# Patient Record
Sex: Male | Born: 1995
Health system: Southern US, Community
[De-identification: ages and names within clinical notes are randomized; demographics above are authoritative.]

## PROBLEM LIST (undated history)

## (undated) DIAGNOSIS — F329 Major depressive disorder, single episode, unspecified: Secondary | ICD-10-CM

## (undated) DIAGNOSIS — F32A Depression, unspecified: Secondary | ICD-10-CM

## (undated) DIAGNOSIS — F419 Anxiety disorder, unspecified: Secondary | ICD-10-CM

## (undated) DIAGNOSIS — I10 Essential (primary) hypertension: Secondary | ICD-10-CM

## (undated) DIAGNOSIS — J45909 Unspecified asthma, uncomplicated: Secondary | ICD-10-CM

## (undated) HISTORY — DX: Unspecified asthma, uncomplicated: J45.909

## (undated) HISTORY — DX: Major depressive disorder, single episode, unspecified: F32.9

## (undated) HISTORY — PX: ADENOIDECTOMY: SUR15

## (undated) HISTORY — DX: Anxiety disorder, unspecified: F41.9

## (undated) HISTORY — PX: WISDOM TOOTH EXTRACTION: SHX21

## (undated) HISTORY — DX: Depression, unspecified: F32.A

---
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
## 2017-04-21 DIAGNOSIS — L700 Acne vulgaris: Secondary | ICD-10-CM | POA: Diagnosis not present

## 2017-06-13 DIAGNOSIS — L700 Acne vulgaris: Secondary | ICD-10-CM | POA: Diagnosis not present

## 2017-10-03 DIAGNOSIS — L700 Acne vulgaris: Secondary | ICD-10-CM | POA: Diagnosis not present

## 2017-12-06 ENCOUNTER — Ambulatory Visit (INDEPENDENT_AMBULATORY_CARE_PROVIDER_SITE_OTHER): Payer: 59 | Admitting: Psychology

## 2017-12-06 DIAGNOSIS — F321 Major depressive disorder, single episode, moderate: Secondary | ICD-10-CM | POA: Diagnosis not present

## 2017-12-06 DIAGNOSIS — F411 Generalized anxiety disorder: Secondary | ICD-10-CM | POA: Diagnosis not present

## 2017-12-14 ENCOUNTER — Ambulatory Visit (INDEPENDENT_AMBULATORY_CARE_PROVIDER_SITE_OTHER): Payer: 59 | Admitting: Psychology

## 2017-12-14 DIAGNOSIS — F411 Generalized anxiety disorder: Secondary | ICD-10-CM

## 2017-12-14 DIAGNOSIS — F321 Major depressive disorder, single episode, moderate: Secondary | ICD-10-CM

## 2017-12-21 ENCOUNTER — Ambulatory Visit (INDEPENDENT_AMBULATORY_CARE_PROVIDER_SITE_OTHER): Payer: 59 | Admitting: Psychology

## 2017-12-21 DIAGNOSIS — F321 Major depressive disorder, single episode, moderate: Secondary | ICD-10-CM | POA: Diagnosis not present

## 2017-12-21 DIAGNOSIS — F411 Generalized anxiety disorder: Secondary | ICD-10-CM

## 2017-12-27 ENCOUNTER — Ambulatory Visit (INDEPENDENT_AMBULATORY_CARE_PROVIDER_SITE_OTHER): Payer: 59 | Admitting: Internal Medicine

## 2017-12-27 VITALS — BP 118/70 | HR 68 | Temp 98.4°F | Ht 68.0 in | Wt 216.4 lb

## 2017-12-27 DIAGNOSIS — L700 Acne vulgaris: Secondary | ICD-10-CM

## 2017-12-27 DIAGNOSIS — F419 Anxiety disorder, unspecified: Secondary | ICD-10-CM | POA: Diagnosis not present

## 2017-12-27 DIAGNOSIS — D1722 Benign lipomatous neoplasm of skin and subcutaneous tissue of left arm: Secondary | ICD-10-CM | POA: Diagnosis not present

## 2017-12-27 DIAGNOSIS — F32A Depression, unspecified: Secondary | ICD-10-CM

## 2017-12-27 DIAGNOSIS — F329 Major depressive disorder, single episode, unspecified: Secondary | ICD-10-CM | POA: Diagnosis not present

## 2017-12-27 MED ORDER — BENZOYL PEROXIDE 10 % EX LIQD: 1.0000 | Freq: Every day | CUTANEOUS | 2 refills | Status: DC

## 2017-12-27 NOTE — Patient Instructions (Addendum)
Try Cetaphil antibacterial wash to body and face  Try Benzoyl peroxide wash to your face and body at night  I will call you later  Think about labwork to check liver kidney, blood counts, urine and thyroid function  We are getting a copy of vaccination record from [place]   Acne Acne is a skin problem that causes pimples. Acne occurs when the pores in the skin get blocked. The pores may become infected with bacteria, or they may become red, sore, and swollen. Acne is a common skin problem, especially for teenagers. Acne usually goes away over time. What are the causes? Each pore contains an oil gland. Oil glands make an oily substance that is called sebum. Acne happens when these glands get plugged with sebum, dead skin cells, and dirt. Then, the bacteria that are normally found in the oil glands multiply and cause inflammation. Acne is commonly triggered by changes in your hormones. These hormonal changes can cause the oil glands to get bigger and to make more sebum. Factors that can make acne worse include:  Hormone changes during: ? Adolescence. ? Women's menstrual cycles. ? Pregnancy.  Oil-based cosmetics and hair products.  Harshly scrubbing the skin.  Strong soaps.  Stress.  Hormone problems that are due to certain diseases.  Long or oily hair rubbing against the skin.  Certain medicines.  Pressure from headbands, backpacks, or shoulder pads.  Exposure to certain oils and chemicals.  What increases the risk? This condition is more likely to develop in:  Teenagers.  People who have a family history of acne.  What are the signs or symptoms? Acne often occurs on the face, neck, chest, and upper back. Symptoms include:  Small, red bumps (pimples or papules).  Whiteheads.  Blackheads.  Small, pus-filled pimples (pustules).  Big, red pimples or pustules that feel tender.  More severe acne can cause:  An infected area that contains a collection of pus  (abscess).  Hard, painful, fluid-filled sacs (cysts).  Scars.  How is this diagnosed? This condition is diagnosed with a medical history and physical exam. Blood tests may also be done. How is this treated? Treatment for this condition can vary depending on the severity of your acne. Treatment may include:  Creams and lotions that prevent oil glands from clogging.  Creams and lotions that treat or prevent infections and inflammation.  Antibiotic medicines that are applied to the skin or taken as a pill.  Pills that decrease sebum production.  Birth control pills.  Light or laser treatments.  Surgery.  Injections of medicine into the affected areas.  Chemicals that cause peeling of the skin.  Your health care provider will also recommend the best way to take care of your skin. Good skin care is the most important part of treatment. Follow these instructions at home: Skin care Take care of your skin as told by your health care provider. You may be told to do these things:  Wash your skin gently at least two times each day, as well as: ? After you exercise. ? Before you go to bed.  Use mild soap.  Apply a water-based skin moisturizer after you wash your skin.  Use a sunscreen or sunblock with SPF 30 or greater. This is especially important if you are using acne medicines.  Choose cosmetics that will not plug your oil glands (are noncomedogenic).  Medicines  Take over-the-counter and prescription medicines only as told by your health care provider.  If you were prescribed  an antibiotic medicine, apply or take it as told by your health care provider. Do not stop taking the antibiotic even if your condition improves. General instructions  Keep your hair clean and off of your face. If you have oily hair, shampoo your hair regularly or daily.  Avoid leaning your chin or forehead against your hands.  Avoid wearing tight headbands or hats.  Avoid picking or squeezing  your pimples. That can make your acne worse and cause scarring.  Keep all follow-up visits as told by your health care provider. This is important.  Shave gently and only when necessary.  Keep a food journal to figure out if any foods are linked with your acne. Contact a health care provider if:  Your acne is not better after eight weeks.  Your acne gets worse.  You have a large area of skin that is red or tender.  You think that you are having side effects from any acne medicine. This information is not intended to replace advice given to you by your health care provider. Make sure you discuss any questions you have with your health care provider. Document Released: 09/17/2000 Document Revised: 05/21/2016 Document Reviewed: 11/27/2014 Elsevier Interactive Patient Education  2018 [employer].  Lipoma A lipoma is a noncancerous (benign) tumor that is made up of fat cells. This is a very common type of soft-tissue growth. Lipomas are usually found under the skin (subcutaneous). They may occur in any tissue of the body that contains fat. Common areas for lipomas to appear include the back, shoulders, buttocks, and thighs. Lipomas grow slowly, and they are usually painless. Most lipomas do not cause problems and do not require treatment. What are the causes? The cause of this condition is not known. What increases the risk? This condition is more likely to develop in:  People who are 34-38 years old.  People who have a family history of lipomas.  What are the signs or symptoms? A lipoma usually appears as a small, round bump under the skin. It may feel soft or rubbery, but the firmness can vary. Most lipomas are not painful. However, a lipoma may become painful if it is located in an area where it pushes on nerves. How is this diagnosed? A lipoma can usually be diagnosed with a physical exam. You may also have tests to confirm the diagnosis and to rule out other conditions. Tests may  include:  Imaging tests, such as a CT scan or MRI.  Removal of a tissue sample to be looked at under a microscope (biopsy).  How is this treated? Treatment is not needed for small lipomas that are not causing problems. If a lipoma continues to get bigger or it causes problems, removal is often the best option. Lipomas can also be removed to improve appearance. Removal of a lipoma is usually done with a surgery in which the fatty cells and the surrounding capsule are removed. Most often, a medicine that numbs the area (local anesthetic) is used for this procedure. Follow these instructions at home:  Keep all follow-up visits as directed by your health care provider. This is important. Contact a health care provider if:  Your lipoma becomes larger or hard.  Your lipoma becomes painful, red, or increasingly swollen. These could be signs of infection or a more serious condition. This information is not intended to replace advice given to you by your health care provider. Make sure you discuss any questions you have with your health care provider. Document  Released: 09/10/2002 Document Revised: 02/26/2016 Document Reviewed: 09/16/2014 Elsevier Interactive Patient Education  [employer].

## 2017-12-27 NOTE — Progress Notes (Signed)
Pre visit review using our clinic review tool, if applicable. No additional management support is needed unless otherwise documented below in the visit note. 

## 2017-12-28 ENCOUNTER — Ambulatory Visit (INDEPENDENT_AMBULATORY_CARE_PROVIDER_SITE_OTHER): Payer: 59 | Admitting: Psychology

## 2017-12-28 DIAGNOSIS — F321 Major depressive disorder, single episode, moderate: Secondary | ICD-10-CM

## 2017-12-28 DIAGNOSIS — F411 Generalized anxiety disorder: Secondary | ICD-10-CM

## 2017-12-29 ENCOUNTER — Encounter: Payer: Self-pay | Admitting: Internal Medicine

## 2017-12-29 DIAGNOSIS — D1722 Benign lipomatous neoplasm of skin and subcutaneous tissue of left arm: Secondary | ICD-10-CM | POA: Insufficient documentation

## 2017-12-29 DIAGNOSIS — F419 Anxiety disorder, unspecified: Principal | ICD-10-CM

## 2017-12-29 DIAGNOSIS — F329 Major depressive disorder, single episode, unspecified: Secondary | ICD-10-CM | POA: Insufficient documentation

## 2017-12-29 DIAGNOSIS — L700 Acne vulgaris: Secondary | ICD-10-CM | POA: Insufficient documentation

## 2017-12-29 NOTE — Progress Notes (Signed)
Chief Complaint  Patient presents with  . Establish Care   New patient to establish care prior pt at [facility] Dr. Wynetta Emery  Of note called mother after visit per pt request ([phone]) [phone] and disc details of the visit and obtained history  1. Anxiety and depression following with Alene Mires for counseling has appt with psychiatry 01/05/18. He was on Lexapro 20 mg in the past pt does not think helped but mother think she saw a difference. She reports he at one time was dx'ed with ADHD which he does not have and was on Stratterra which did not work for him. He has seen psychiatry in the past as a child 2. Acne to face, chest and back not currently using anything but has seen dermatology in the past for acne  3. C/o left forearm growth/lesion been there for a while and not changing or painful   Review of Systems  Constitutional: Negative for weight loss.  HENT: Negative for hearing loss.   Eyes: Negative for blurred vision.  Respiratory: Negative for shortness of breath.   Cardiovascular: Negative for chest pain.  Gastrointestinal: Negative for abdominal pain.  Musculoskeletal: Negative for joint pain.  Skin:       +acne   Neurological: Negative for headaches.  Psychiatric/Behavioral: Positive for depression. Negative for suicidal ideas. The patient is nervous/anxious.    Past Medical History:  Diagnosis Date  . Anxiety   . Asthma   . Depression    Past Surgical History:  Procedure Laterality Date  . ADENOIDECTOMY     2004   Family History  Problem Relation Age of Onset  . Depression Mother   . Hyperlipidemia Mother   . Hypertension Mother   . Asthma Sister   . Cancer Paternal Grandmother   . Early death Paternal Grandmother   . Cancer Paternal Grandfather    Social History   Socioeconomic History  . Marital status: Single    Spouse name: Not on file  . Number of children: Not on file  . Years of education: Not on file  . Highest education level: Not on file    Occupational History  . Not on file  Social Needs  . Financial resource strain: Not on file  . Food insecurity:    Worry: Not on file    Inability: Not on file  . Transportation needs:    Medical: Not on file    Non-medical: Not on file  Tobacco Use  . Smoking status: Never Smoker  . Smokeless tobacco: Never Used  Substance and Sexual Activity  . Alcohol use: Not Currently  . Drug use: Not Currently  . Sexual activity: Not Currently  Lifestyle  . Physical activity:    Days per week: Not on file    Minutes per session: Not on file  . Stress: Not on file  Relationships  . Social connections:    Talks on phone: Not on file    Gets together: Not on file    Attends religious service: Not on file    Active member of club or organization: Not on file    Attends meetings of clubs or organizations: Not on file    Relationship status: Not on file  . Intimate partner violence:    Fear of current or ex partner: Not on file    Emotionally abused: Not on file    Physically abused: Not on file    Forced sexual activity: Not on file  Other Topics Concern  . Not  on file  Social History Narrative   College at [employer]   Wears seat belt    Safe in relationship    Current Meds  Medication Sig  . escitalopram (LEXAPRO) 20 MG tablet Take 20 mg by mouth daily.   No Known Allergies No results found for this or any previous visit (from the past 2160 hour(s)). Objective  Body mass index is 32.9 kg/m. Wt Readings from Last 3 Encounters:  12/27/17 216 lb 6.4 oz (98.2 kg)   Temp Readings from Last 3 Encounters:  12/27/17 98.4 F (36.9 C) (Oral)   BP Readings from Last 3 Encounters:  12/27/17 118/70   Pulse Readings from Last 3 Encounters:  12/27/17 68    Physical Exam  Constitutional: He is oriented to person, place, and time and well-developed, well-nourished, and in no distress. Vital signs are normal.  HENT:  Head: Normocephalic and atraumatic.  Mouth/Throat: Oropharynx is  clear and moist and mucous membranes are normal.  Eyes: Pupils are equal, round, and reactive to light. Conjunctivae are normal.  Cardiovascular: Normal rate, regular rhythm and normal heart sounds.  Pulmonary/Chest: Effort normal and breath sounds normal.  Abdominal: Soft. Bowel sounds are normal.  Neurological: He is alert and oriented to person, place, and time. Gait normal. Gait normal.  Skin: Skin is warm and dry.     Acne to face chest and back   Psychiatric: Mood, memory and judgment normal. He has a flat affect.  Nursing note and vitals reviewed.   Assessment   1. Anxiety/depression  2. Ance vulgaris  3. Likely lipoma to left forearm  4. HM Plan  1. Collateral info obtained from mother as far as previous meds see HPI appt with psych 01/05/18 further tx  F/u with Alene Mires for counseling as well  Requested records from upcoming psych appt  2. rec use OTC cetaphil antibaterial wash with benzoyl peroxide wash to face, chest and body  Assess at f/u if not better consider oral tetracyclines 3. Monitor given info  4. Declined flu shot  Get vx record from [place] today disc with mother will reassess at f/u  Provider: Dr. Olivia Mackie McLean-Scocuzza-[facility]

## 2018-01-03 ENCOUNTER — Ambulatory Visit: Payer: Self-pay | Admitting: Psychiatry

## 2018-01-04 ENCOUNTER — Ambulatory Visit (INDEPENDENT_AMBULATORY_CARE_PROVIDER_SITE_OTHER): Payer: 59 | Admitting: Psychology

## 2018-01-04 DIAGNOSIS — F321 Major depressive disorder, single episode, moderate: Secondary | ICD-10-CM

## 2018-01-04 DIAGNOSIS — F411 Generalized anxiety disorder: Secondary | ICD-10-CM

## 2018-01-05 ENCOUNTER — Ambulatory Visit (INDEPENDENT_AMBULATORY_CARE_PROVIDER_SITE_OTHER): Payer: 59 | Admitting: Psychiatry

## 2018-01-05 ENCOUNTER — Other Ambulatory Visit: Payer: Self-pay

## 2018-01-05 ENCOUNTER — Encounter: Payer: Self-pay | Admitting: Psychiatry

## 2018-01-05 VITALS — BP 142/80 | HR 97 | Temp 97.4°F | Wt 216.4 lb

## 2018-01-05 DIAGNOSIS — F330 Major depressive disorder, recurrent, mild: Secondary | ICD-10-CM

## 2018-01-05 DIAGNOSIS — F4010 Social phobia, unspecified: Secondary | ICD-10-CM

## 2018-01-05 DIAGNOSIS — F411 Generalized anxiety disorder: Secondary | ICD-10-CM

## 2018-01-05 MED ORDER — FLUOXETINE HCL 10 MG PO CAPS: 10.0000 mg | ORAL_CAPSULE | Freq: Every day | ORAL | 1 refills | Status: DC

## 2018-01-05 NOTE — Progress Notes (Signed)
Psychiatric Initial Adult Assessment   Patient Identification: Cory Cory [name] MRN:  [id number] Date of Evaluation:  01/05/2018 Referral Source: Therapist Cory Cory [name] Chief Complaint:  ' I am anxious." Chief Complaint    Establish Care; Anxiety; ADHD; Panic Attack; Insomnia     Visit Diagnosis:    ICD-10-CM   1. GAD (generalized anxiety disorder) F41.1 FLUoxetine (PROZAC) 10 MG capsule  2. Social anxiety disorder F40.10   3. MDD (major depressive disorder), recurrent episode, mild (HCC) F33.0     History of Present Illness:  Cory Cory [name] Cory [name] a 22 year old Caucasian male, single, lives in [place], student at [employer] Cory [name], has a history of depression and anxiety, presented to the clinic Cory [name] to establish care.  Crit Cory [name] presented along with his mother.  Patient as well as mother participated in the evaluation together.  Mother provided collateral information.  Cory Cory [name] reports he has been struggling with depression since the age of 22 years old.  Patient reports he has tried medications like Lexapro, Zoloft, Strattera in the past.  Most recently he was on Lexapro but quit taking it 4 years ago.  Patient was on Lexapro for at least 2 years.  While he was on Lexapro he did well for a while but then he quit taking it because he felt like it was not helping anymore.  Patient currently Cory [name] in psychotherapy with therapists Cory Cory [name].  Patient reports most recently he has been struggling with some depressive symptoms like sadness on a daily basis, low energy, lack of motivation, sleep problems, lack of appetite and so on.  Patient did have some suicidal thoughts in the past but currently denies it.  Patient also denies any suicide attempts.  Patient also reports anxiety symptoms.  Patient reports racing heart rate, shortness of breath, nervousness, trouble relaxing, being easily annoyed or irritable and so on.  Patient reports he Cory [name] also socially anxious.  He also reports panic symptoms when he  cannot breathe and has pressure on his chest.  Patient reports this happens for 5 minutes or so.  He copes by distracting himself and moving away from the situation.  Pt does report a history of bullying while at school.  Patient reports it did affect him at that time but he does not think about it a lot this time.   Patient denies any history of mania or hypomania.  Patient denies any perceptual disturbances.  Patient currently sees Cory Cory [name] for therapy.  Per mother patient even though was depressed did very well in school.  Patient also did not have any developmental problems as a child.  Associated Signs/Symptoms: Depression Symptoms:  depressed mood, fatigue, anxiety, disturbed sleep, decreased appetite,   (Hypo) Manic Symptoms:  denies   Anxiety Symptoms:  Excessive Worry, Panic Symptoms, Social Anxiety, Psychotic Symptoms:  denies PTSD Symptoms: Negative  Past Psychiatric History: Patient reports a history of depression since the age of 22 years old.  Patient has had psychotherapy in the past.  Patient currently sees therapists Cory Cory [name].  Patient has tried medications, antidepressants and antianxiety agents in the past.  Patient denies any suicide attempts.  Previous Psychotropic Medications: Yes , Lexapro, Zoloft, Strattera.  Substance Abuse History in the last 12 months:  No.  Consequences of Substance Abuse: Negative  Past Medical History:  Past Medical History:  Diagnosis Date  . Anxiety   . Asthma   . Depression     Past Surgical History:  Procedure Laterality Date  . ADENOIDECTOMY     2004  Family Psychiatric History: Mother has  depression.  Family History:  Family History  Problem Relation Age of Onset  . Depression Mother   . Hyperlipidemia Mother   . Hypertension Mother   . Anxiety disorder Mother   . Asthma Sister   . Cancer Paternal Grandmother   . Early death Paternal Grandmother   . Cancer Paternal Grandfather      Social History:   Social History   Socioeconomic History  . Marital status: Single    Spouse name: Not on file  . Number of children: 0  . Years of education: Not on file  . Highest education level: Some college, no degree  Occupational History    Comment: full time student  Social Needs  . Financial resource strain: Not on file  . Food insecurity:    Worry: Not on file    Inability: Not on file  . Transportation needs:    Medical: No    Non-medical: No  Tobacco Use  . Smoking status: Former Smoker    Types: E-cigarettes, Cigarettes  . Smokeless tobacco: Never Used  . Tobacco comment: tired a couple  times  Substance and Sexual Activity  . Alcohol use: Not Currently  . Drug use: Not Currently  . Sexual activity: Not Currently  Lifestyle  . Physical activity:    Days per week: 4 days    Minutes per session: 30 min  . Stress: Very much  Relationships  . Social connections:    Talks on phone: More than three times a week    Gets together: More than three times a week    Attends religious service: Never    Active member of club or organization: No    Attends meetings of clubs or organizations: Never    Relationship status: Never married  Other Topics Concern  . Not on file  Social History Narrative   College at [employer] Cory [name]   Wears seat belt    Safe in relationship     Additional Social History: Patient Cory [name] single.  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  He lives in [place] with his mother.  He has 2 siblings a brother and a sister.  Patient's mother Cory [name] a Pharmacist, Cory [name].  His parents are divorced.  He however reports his father Cory [name] very involved in his life.  He loves to play sports, baseball, basketball and soccer.  Allergies:  No Known Allergies  Metabolic Disorder Labs: No results found for: HGBA1C, MPG No results found for: PROLACTIN No results found for: CHOL, TRIG, HDL, CHOLHDL, VLDL, LDLCALC   Current Medications: Current Outpatient Medications  Medication  Sig Dispense Refill  . Benzoyl Peroxide (BENZOYL PEROXIDE) 10 % LIQD Apply 1 Bottle topically at bedtime. Apply in the shower to face, chest and back lather and let sit then rinse. 140 g 2  . FLUoxetine (PROZAC) 10 MG capsule Take 1 capsule (10 mg total) by mouth daily with breakfast. 30 capsule 1   No current facility-administered medications for this visit.     Neurologic: Headache: No Seizure: No Paresthesias:No  Musculoskeletal: Strength & Muscle Tone: within normal limits Gait & Station: normal Patient leans: N/A  Psychiatric Specialty Exam: Review of Systems  Psychiatric/Behavioral: Positive for depression. The patient Cory [name] nervous/anxious.   All other systems reviewed and are negative.   Blood pressure (!) 142/80, pulse 97, temperature (!) 97.4 F (36.3 C), temperature source Oral, weight 216 lb 6.4 oz (98.2 kg).Body mass index Cory [name] 32.9 kg/m.  General Appearance:  Casual  Eye Contact:  Fair  Speech:  Normal Rate  Volume:  Normal  Mood:  Anxious and Dysphoric  Affect:  Congruent  Thought Process:  Goal Directed and Descriptions of Associations: Intact  Orientation:  Full (Time, Place, and Person)  Thought Content:  Logical  Suicidal Thoughts:  No  Homicidal Thoughts:  No  Memory:  Immediate;   Fair Recent;   Fair Remote;   Fair  Judgement:  Fair  Insight:  Fair  Psychomotor Activity:  Normal  Concentration:  Concentration: Fair and Attention Span: Fair  Recall:  Cory Cory [name]: Fair  Akathisia:  No  Handed:  Right  AIMS (if indicated):  na  Assets:  Communication Skills Desire for Cory [place] [name] [place] Talents/Skills Transportation Vocational/Educational  ADL's:  Intact  Cognition: WNL  Sleep:  Fair on medications    Treatment Plan Summary: Cory Cory [name] a 22 year old Caucasian male, single, lives in [place], has a history of depression, anxiety, presented to the clinic Cory [name] to establish care.   Cory Cory [name] has a long-term history of depression since the age of 11.  He Cory [name] currently in psychotherapy with therapists Cory Cory [name].  Patient has good social support system.  Denies any suicidality or substance abuse problems. Discussed medication with patient.  Plan as noted below. Medication management and Plan as noted below   Plan  MDD Start Prozac 10 mg daily with breakfast. Provided medication education, provided handouts. PHQ 9 equals 14  For GAD Prozac 10 mg daily with breakfast GAD 7 equals 13 Continue therapy with therapists [facility] Cory [name].  Social anxiety disorder Continue CBT.  We will order TSH if not already done.  Provided medication education, provided handouts.  Follow-up in clinic in 3 weeks or sooner if needed.  More than 50 % of the time was spent for psychoeducation and supportive psychotherapy and care coordination.  This note was generated in part or whole with voice recognition software. Voice recognition Cory [name] usually quite accurate but there are transcription errors that can and very often do occur. I apologize for any typographical errors that were not detected and corrected.         Ursula Alert, MD 4/5/20198:46 AM

## 2018-01-05 NOTE — Patient Instructions (Signed)
Fluoxetine capsules or tablets (Depression/Mood Disorders) What is this medicine? FLUOXETINE (floo OX e teen) belongs to a class of drugs known as selective serotonin reuptake inhibitors (SSRIs). It helps to treat mood problems such as depression, obsessive compulsive disorder, and panic attacks. It can also treat certain eating disorders. This medicine may be used for other purposes; ask your health care provider or pharmacist if you have questions. COMMON BRAND NAME(S): Prozac What should I tell my health care provider before I take this medicine? They need to know if you have any of these conditions: -bipolar disorder or a family history of bipolar disorder -bleeding disorders -glaucoma -heart disease -liver disease -low levels of sodium in the blood -seizures -suicidal thoughts, plans, or attempt; a previous suicide attempt by you or a family member -take MAOIs like Carbex, Eldepryl, Marplan, Nardil, and Parnate -take medicines that treat or prevent blood clots -thyroid disease -an unusual or allergic reaction to fluoxetine, other medicines, foods, dyes, or preservatives -pregnant or trying to get pregnant -breast-feeding How should I use this medicine? Take this medicine by mouth with a glass of water. Follow the directions on the prescription label. You can take this medicine with or without food. Take your medicine at regular intervals. Do not take it more often than directed. Do not stop taking this medicine suddenly except upon the advice of your doctor. Stopping this medicine too quickly may cause serious side effects or your condition may worsen. A special MedGuide will be given to you by the pharmacist with each prescription and refill. Be sure to read this information carefully each time. Talk to your pediatrician regarding the use of this medicine in children. While this drug may be prescribed for children as young as 7 years for selected conditions, precautions do  apply. Overdosage: If you think you have taken too much of this medicine contact a poison control center or emergency room at once. NOTE: This medicine is only for you. Do not share this medicine with others. What if I miss a dose? If you miss a dose, skip the missed dose and go back to your regular dosing schedule. Do not take double or extra doses. What may interact with this medicine? Do not take this medicine with any of the following medications: -other medicines containing fluoxetine, like Sarafem or Symbyax -cisapride -linezolid -MAOIs like Carbex, Eldepryl, Marplan, Nardil, and Parnate -methylene blue (injected into a vein) -pimozide -thioridazine This medicine may also interact with the following medications: -alcohol -amphetamines -aspirin and aspirin-like medicines -carbamazepine -certain medicines for depression, anxiety, or psychotic disturbances -certain medicines for migraine headaches like almotriptan, eletriptan, frovatriptan, naratriptan, rizatriptan, sumatriptan, zolmitriptan -digoxin -diuretics -fentanyl -flecainide -furazolidone -isoniazid -lithium -medicines for sleep -medicines that treat or prevent blood clots like warfarin, enoxaparin, and dalteparin -NSAIDs, medicines for pain and inflammation, like ibuprofen or naproxen -phenytoin -procarbazine -propafenone -rasagiline -ritonavir -supplements like St. John's wort, kava kava, valerian -tramadol -tryptophan -vinblastine This list may not describe all possible interactions. Give your health care provider a list of all the medicines, herbs, non-prescription drugs, or dietary supplements you use. Also tell them if you smoke, drink alcohol, or use illegal drugs. Some items may interact with your medicine. What should I watch for while using this medicine? Tell your doctor if your symptoms do not get better or if they get worse. Visit your doctor or health care professional for regular checks on your  progress. Because it may take several weeks to see the full effects of this medicine, it   is important to continue your treatment as prescribed by your doctor. Patients and their families should watch out for new or worsening thoughts of suicide or depression. Also watch out for sudden changes in feelings such as feeling anxious, agitated, panicky, irritable, hostile, aggressive, impulsive, severely restless, overly excited and hyperactive, or not being able to sleep. If this happens, especially at the beginning of treatment or after a change in dose, call your health care professional. You may get drowsy or dizzy. Do not drive, use machinery, or do anything that needs mental alertness until you know how this medicine affects you. Do not stand or sit up quickly, especially if you are an older patient. This reduces the risk of dizzy or fainting spells. Alcohol may interfere with the effect of this medicine. Avoid alcoholic drinks. Your mouth may get dry. Chewing sugarless gum or sucking hard candy, and drinking plenty of water may help. Contact your doctor if the problem does not go away or is severe. This medicine may affect blood sugar levels. If you have diabetes, check with your doctor or health care professional before you change your diet or the dose of your diabetic medicine. What side effects may I notice from receiving this medicine? Side effects that you should report to your doctor or health care professional as soon as possible: -allergic reactions like skin rash, itching or hives, swelling of the face, lips, or tongue -anxious -black, tarry stools -breathing problems -changes in vision -confusion -elevated mood, decreased need for sleep, racing thoughts, impulsive behavior -eye pain -fast, irregular heartbeat -feeling faint or lightheaded, falls -feeling agitated, angry, or irritable -hallucination, loss of contact with reality -loss of balance or coordination -loss of memory -painful  or prolonged erections -restlessness, pacing, inability to keep still -seizures -stiff muscles -suicidal thoughts or other mood changes -trouble sleeping -unusual bleeding or bruising -unusually weak or tired -vomiting Side effects that usually do not require medical attention (report to your doctor or health care professional if they continue or are bothersome): -change in appetite or weight -change in sex drive or performance -diarrhea -dry mouth -headache -increased sweating -nausea -tremors This list may not describe all possible side effects. Call your doctor for medical advice about side effects. You may report side effects to FDA at 1-800-FDA-1088. Where should I keep my medicine? Keep out of the reach of children. Store at room temperature between 15 and 30 degrees C (59 and 86 degrees F). Throw away any unused medicine after the expiration date. NOTE: This sheet is a summary. It may not cover all possible information. If you have questions about this medicine, talk to your doctor, pharmacist, or health care provider.  2018 Elsevier/Gold Standard (2016-02-21 15:55:27)  

## 2018-01-06 ENCOUNTER — Encounter: Payer: Self-pay | Admitting: Internal Medicine

## 2018-01-06 ENCOUNTER — Ambulatory Visit (INDEPENDENT_AMBULATORY_CARE_PROVIDER_SITE_OTHER): Payer: 59 | Admitting: Internal Medicine

## 2018-01-06 ENCOUNTER — Encounter: Payer: Self-pay | Admitting: Psychiatry

## 2018-01-06 VITALS — BP 126/60 | HR 100 | Temp 98.9°F | Ht 68.0 in | Wt 219.6 lb

## 2018-01-06 DIAGNOSIS — R519 Headache, unspecified: Secondary | ICD-10-CM | POA: Insufficient documentation

## 2018-01-06 DIAGNOSIS — R51 Headache: Secondary | ICD-10-CM

## 2018-01-06 MED ORDER — BUTALBITAL-APAP-CAFFEINE 50-325-40 MG PO TABS: 1.0000 | ORAL_TABLET | Freq: Three times a day (TID) | ORAL | 0 refills | Status: DC | PRN

## 2018-01-06 NOTE — Progress Notes (Signed)
Pre visit review using our clinic review tool, if applicable. No additional management support is needed unless otherwise documented below in the visit note. 

## 2018-01-06 NOTE — Progress Notes (Addendum)
Chief Complaint  Patient presents with  . Headache    for two week    F/u  1. C/o h/a temporal and crown x 2 weeks ? Stress related also had been drinking 7-8 sodas daily and stopped this amt abruptly over the last week. Pain was 2/10 constant x 2 weeks no n/v. No h/o migraines. He feels like his head is heavy. He usu. Gets 8 hrs of sleep but last night 5-6/10. He has tried Ibuprofen, Excedrin migraine 2 pills which has not helped. H/a is annoying FH migraines in ? Mom per pt.  He  Has not started Prozac yet 10 mg and will start tonight .   Review of Systems  Constitutional: Negative for fever.  Eyes: Negative for blurred vision.  Respiratory: Negative for shortness of breath.   Cardiovascular: Negative for chest pain.  Gastrointestinal: Negative for nausea and vomiting.  Neurological: Positive for headaches.   Past Medical History:  Diagnosis Date  . Anxiety   . Asthma   . Depression    Past Surgical History:  Procedure Laterality Date  . ADENOIDECTOMY     2004   Family History  Problem Relation Age of Onset  . Depression Mother   . Hyperlipidemia Mother   . Hypertension Mother   . Anxiety disorder Mother   . Asthma Sister   . Cancer Paternal Grandmother   . Early death Paternal Grandmother   . Cancer Paternal Grandfather    Social History   Socioeconomic History  . Marital status: Single    Spouse name: Not on file  . Number of children: 0  . Years of education: Not on file  . Highest education level: Some college, no degree  Occupational History    Comment: full time student  Social Needs  . Financial resource strain: Not on file  . Food insecurity:    Worry: Not on file    Inability: Not on file  . Transportation needs:    Medical: No    Non-medical: No  Tobacco Use  . Smoking status: Former Smoker    Types: E-cigarettes, Cigarettes  . Smokeless tobacco: Never Used  . Tobacco comment: tired a couple  times  Substance and Sexual Activity  . Alcohol  use: Not Currently  . Drug use: Not Currently  . Sexual activity: Not Currently  Lifestyle  . Physical activity:    Days per week: 4 days    Minutes per session: 30 min  . Stress: Very much  Relationships  . Social connections:    Talks on phone: More than three times a week    Gets together: More than three times a week    Attends religious service: Never    Active member of club or organization: No    Attends meetings of clubs or organizations: Never    Relationship status: Never married  . Intimate partner violence:    Fear of current or ex partner: No    Emotionally abused: No    Physically abused: No    Forced sexual activity: No  Other Topics Concern  . Not on file  Social History Narrative   College at [employer]   Wears seat belt    Safe in relationship    Current Meds  Medication Sig  . Benzoyl Peroxide (BENZOYL PEROXIDE) 10 % LIQD Apply topically at bedtime.  Marland Kitchen FLUoxetine (PROZAC) 10 MG capsule Take 1 capsule (10 mg total) by mouth daily with breakfast.  . [DISCONTINUED] Benzoyl Peroxide (BENZOYL PEROXIDE) 10 %  LIQD Apply 1 Bottle topically at bedtime. Apply in the shower to face, chest and back lather and let sit then rinse.   No Known Allergies No results found for this or any previous visit (from the past 2160 hour(s)). Objective  Body mass index is 33.39 kg/m. Wt Readings from Last 3 Encounters:  01/06/18 219 lb 9.6 oz (99.6 kg)  01/05/18 216 lb 6.4 oz (98.2 kg)  12/27/17 216 lb 6.4 oz (98.2 kg)   Temp Readings from Last 3 Encounters:  01/06/18 98.9 F (37.2 C) (Oral)  01/05/18 (!) 97.4 F (36.3 C) (Oral)  12/27/17 98.4 F (36.9 C) (Oral)   BP Readings from Last 3 Encounters:  01/06/18 126/60  01/05/18 (!) 142/80  12/27/17 118/70   Pulse Readings from Last 3 Encounters:  01/06/18 100  01/05/18 97  12/27/17 68    Physical Exam  Constitutional: He is oriented to person, place, and time and well-developed, well-nourished, and in no distress. Vital  signs are normal.  HENT:  Head: Normocephalic and atraumatic.  Nose: Right sinus exhibits frontal sinus tenderness. Left sinus exhibits frontal sinus tenderness.  Eyes: Pupils are equal, round, and reactive to light. Conjunctivae are normal.  Cardiovascular: Normal rate, regular rhythm and normal heart sounds.  Pulmonary/Chest: Effort normal.  Neurological: He is alert and oriented to person, place, and time. Gait normal. Gait normal.  CN 2-12 grossly intact  Normal motor upper and lower ext b/l 5/5  Normal sensation   Skin: Skin is warm, dry and intact.  Psychiatric: Mood, memory, affect and judgment normal.  Nursing note and vitals reviewed.   Assessment   1. H/a ddx/etiology tension, increased caffeine intake, reduced sleep/hydration, sinus pain though pt has not other sxs of sinusitis. Not c/w meningeal sx's  Plan  1. Trial of fiorocet tid prn 1-2 pills not to take more than 2x per week Stop excedrine migraine for now Increase sleep and water  Taper caffeine but done not abruptly stop  F/u in 1 week prn otherwise as sch  CT head ordered if h/a improved will cancel   Vaccines-see chart scanned in  Tdap 03/25/09  Had menveo 07/19/11 and 03/19/15 Had hep B vaccine  Had varicella x 2 doses  Had polio  Had MMR Had Hib Had hep A vaccine    Provider: Dr. Olivia Mackie Mc[facility]oc[facility]-Internal Medicine

## 2018-01-06 NOTE — Patient Instructions (Addendum)
Please follow up as scheduled sooner if needed We have ordered CT head   General Headache Without Cause A headache is pain or discomfort felt around the head or neck area. There are many causes and types of headaches. In some cases, the cause may not be found. Follow these instructions at home: Managing pain  Take over-the-counter and prescription medicines only as told by your doctor.  Lie down in a dark, quiet room when you have a headache.  If directed, apply ice to the head and neck area: ? Put ice in a plastic bag. ? Place a towel between your skin and the bag. ? Leave the ice on for 20 minutes, 2-3 times per day.  Use a heating pad or hot shower to apply heat to the head and neck area as told by your doctor.  Keep lights dim if bright lights bother you or make your headaches worse. Eating and drinking  Eat meals on a regular schedule.  Lessen how much alcohol you drink.  Lessen how much caffeine you drink, or stop drinking caffeine. General instructions  Keep all follow-up visits as told by your doctor. This is important.  Keep a journal to find out if certain things bring on headaches. For example, write down: ? What you eat and drink. ? How much sleep you get. ? Any change to your diet or medicines.  Relax by getting a massage or doing other relaxing activities.  Lessen stress.  Sit up straight. Do not tighten (tense) your muscles.  Do not use tobacco products. This includes cigarettes, chewing tobacco, or e-cigarettes. If you need help quitting, ask your doctor.  Exercise regularly as told by your doctor.  Get enough sleep. This often means 7-9 hours of sleep. Contact a doctor if:  Your symptoms are not helped by medicine.  You have a headache that feels different than the other headaches.  You feel sick to your stomach (nauseous) or you throw up (vomit).  You have a fever. Get help right away if:  Your headache becomes really bad.  You keep  throwing up.  You have a stiff neck.  You have trouble seeing.  You have trouble speaking.  You have pain in the eye or ear.  Your muscles are weak or you lose muscle control.  You lose your balance or have trouble walking.  You feel like you will pass out (faint) or you pass out.  You have confusion. This information is not intended to replace advice given to you by your health care provider. Make sure you discuss any questions you have with your health care provider. Document Released: 06/29/2008 Document Revised: 02/26/2016 Document Reviewed: 01/13/2015 Elsevier Interactive Patient Education  [employer].

## 2018-01-11 ENCOUNTER — Ambulatory Visit (INDEPENDENT_AMBULATORY_CARE_PROVIDER_SITE_OTHER): Payer: 59 | Admitting: Psychology

## 2018-01-11 DIAGNOSIS — F411 Generalized anxiety disorder: Secondary | ICD-10-CM

## 2018-01-11 DIAGNOSIS — F321 Major depressive disorder, single episode, moderate: Secondary | ICD-10-CM

## 2018-01-12 ENCOUNTER — Ambulatory Visit
Admission: RE | Admit: 2018-01-12 | Discharge: 2018-01-12 | Disposition: A | Discharge status: Home / Self Care | Payer: 59 | Source: Ambulatory Visit | Attending: Internal Medicine | Admitting: Internal Medicine

## 2018-01-12 DIAGNOSIS — R51 Headache: Secondary | ICD-10-CM | POA: Diagnosis not present

## 2018-01-12 DIAGNOSIS — R519 Headache, unspecified: Secondary | ICD-10-CM

## 2018-01-18 ENCOUNTER — Ambulatory Visit (INDEPENDENT_AMBULATORY_CARE_PROVIDER_SITE_OTHER): Payer: 59 | Admitting: Psychology

## 2018-01-18 DIAGNOSIS — F321 Major depressive disorder, single episode, moderate: Secondary | ICD-10-CM | POA: Diagnosis not present

## 2018-01-18 DIAGNOSIS — F411 Generalized anxiety disorder: Secondary | ICD-10-CM

## 2018-01-25 ENCOUNTER — Ambulatory Visit (INDEPENDENT_AMBULATORY_CARE_PROVIDER_SITE_OTHER): Payer: 59 | Admitting: Psychology

## 2018-01-25 DIAGNOSIS — F321 Major depressive disorder, single episode, moderate: Secondary | ICD-10-CM | POA: Diagnosis not present

## 2018-01-25 DIAGNOSIS — F411 Generalized anxiety disorder: Secondary | ICD-10-CM | POA: Diagnosis not present

## 2018-01-25 DIAGNOSIS — F4010 Social phobia, unspecified: Secondary | ICD-10-CM | POA: Diagnosis not present

## 2018-01-26 ENCOUNTER — Ambulatory Visit (INDEPENDENT_AMBULATORY_CARE_PROVIDER_SITE_OTHER): Payer: 59 | Admitting: Psychiatry

## 2018-01-26 ENCOUNTER — Encounter: Payer: Self-pay | Admitting: Psychiatry

## 2018-01-26 ENCOUNTER — Other Ambulatory Visit: Payer: Self-pay

## 2018-01-26 VITALS — BP 140/85 | HR 76 | Temp 97.8°F | Wt 208.8 lb

## 2018-01-26 DIAGNOSIS — F411 Generalized anxiety disorder: Secondary | ICD-10-CM | POA: Diagnosis not present

## 2018-01-26 DIAGNOSIS — F4010 Social phobia, unspecified: Secondary | ICD-10-CM

## 2018-01-26 DIAGNOSIS — F330 Major depressive disorder, recurrent, mild: Secondary | ICD-10-CM | POA: Diagnosis not present

## 2018-01-26 MED ORDER — FLUOXETINE HCL 20 MG PO CAPS: 20.0000 mg | ORAL_CAPSULE | Freq: Every day | ORAL | 1 refills | Status: DC

## 2018-01-26 NOTE — Progress Notes (Signed)
[facility] MD OP Progress Note  01/26/2018 4:40 PM Cory [name]  MRN:  [id number]  Chief Complaint: ' I am here for follow up.' Chief Complaint    Follow-up; Medication Refill     HPI: Cory [name] is a 22 year old Caucasian male, single, lives in [place], student at [employer], has a history of depression, anxiety, presented to the clinic today for a follow-up visit.  Patient today reports he has been taking the Prozac as prescribed.  He denies any side effects.  He however reports he has not noticed much benefit yet from the medication.  He continues to have some depressive symptoms like anhedonia and feeling sad on and off.  He denies any suicidality.  He reports he is able to concentrate at school and has been able to keep up with his projects and assignments.  He continues to have anxiety symptoms, mostly social anxiety.  He reports he is mostly withdrawn.  He reports his anxiety symptoms as feeling anxious, worrying too much, being restless at times and feeling nervous and so on.  He continues to work with his therapists Debbe Bales on the same.  He continues to have good sleep.  He denies any other concerns today.  He continues to have good support system from his parents.  Visit Diagnosis:    ICD-10-CM   1. GAD (generalized anxiety disorder) F41.1   2. Social anxiety disorder F40.10   3. MDD (major depressive disorder), recurrent episode, mild ([place]) F33.0     Past Psychiatric History: Have reviewed past psychiatric history from my progress note on 01/05/2018.  Past trials of Lexapro, Zoloft, Strattera  Past Medical History:  Past Medical History:  Diagnosis Date  . Anxiety   . Asthma   . Depression     Past Surgical History:  Procedure Laterality Date  . ADENOIDECTOMY     2004    Family Psychiatric History: Mother - depression .  Family History:  Family History  Problem Relation Age of Onset  . Depression Mother   . Hyperlipidemia Mother   . Hypertension Mother   .  Anxiety disorder Mother   . Asthma Sister   . Cancer Paternal Grandmother   . Early death Paternal Grandmother   . Cancer Paternal Grandfather     Social History: Pt is single.  He is student at [employer] doing basic classes.  He lives in [place] with his mother.  He has 2 siblings a brother and a sister.  Patient's mother is a Pharmacist, hospital.  His parents are divorced.  He however reports his father is very involved in his life. Social History   Socioeconomic History  . Marital status: Single    Spouse name: Not on file  . Number of children: 0  . Years of education: Not on file  . Highest education level: Some college, no degree  Occupational History    Comment: full time student  Social Needs  . Financial resource strain: Not on file  . Food insecurity:    Worry: Not on file    Inability: Not on file  . Transportation needs:    Medical: No    Non-medical: No  Tobacco Use  . Smoking status: Former Smoker    Types: E-cigarettes, Cigarettes  . Smokeless tobacco: Never Used  . Tobacco comment: tired a couple  times  Substance and Sexual Activity  . Alcohol use: Not Currently  . Drug use: Not Currently  . Sexual activity: Not Currently  Lifestyle  . Physical activity:  Days per week: 4 days    Minutes per session: 30 min  . Stress: Very much  Relationships  . Social connections:    Talks on phone: More than three times a week    Gets together: More than three times a week    Attends religious service: Never    Active member of club or organization: No    Attends meetings of clubs or organizations: Never    Relationship status: Never married  Other Topics Concern  . Not on file  Social History Narrative   College at [employer]   Wears seat belt    Safe in relationship     Allergies: No Known Allergies  Metabolic Disorder Labs: No results found for: HGBA1C, MPG No results found for: PROLACTIN No results found for: CHOL, TRIG, HDL, CHOLHDL, VLDL, LDLCALC Lab Results   Component Value Date   TSH 3.42 01/20/2012    Therapeutic Level Labs: No results found for: LITHIUM No results found for: VALPROATE No components found for:  CBMZ  Current Medications: Current Outpatient Medications  Medication Sig Dispense Refill  . Benzoyl Peroxide (BENZOYL PEROXIDE) 10 % LIQD Apply topically at bedtime.    . butalbital-acetaminophen-caffeine (FIORICET, ESGIC) 50-325-40 MG tablet Take 1-2 tablets by mouth every 8 (eight) hours as needed for headache. Do not take more than 6 pills in 1 day 24 tablet 0  . FLUoxetine (PROZAC) 20 MG capsule Take 1 capsule (20 mg total) by mouth daily. 30 capsule 1   No current facility-administered medications for this visit.      Musculoskeletal: Strength & Muscle Tone: within normal limits Gait & Station: normal Patient leans: N/A  Psychiatric Specialty Exam: Review of Systems  Psychiatric/Behavioral: Positive for depression. The patient is nervous/anxious.   All other systems reviewed and are negative.   Blood pressure 140/85, pulse 76, temperature 97.8 F (36.6 C), temperature source Oral, weight 208 lb 12.8 oz (94.7 kg).Body mass index is 31.75 kg/m.  General Appearance: Casual  Eye Contact:  Fair  Speech:  Clear and Coherent  Volume:  Normal  Mood:  Anxious and Dysphoric  Affect:  Congruent  Thought Process:  Goal Directed and Descriptions of Associations: Intact  Orientation:  Full (Time, Place, and Person)  Thought Content: Logical   Suicidal Thoughts:  No  Homicidal Thoughts:  No  Memory:  Immediate;   Fair Recent;   Fair Remote;   Fair  Judgement:  Fair  Insight:  Good  Psychomotor Activity:  Normal  Concentration:  Concentration: Fair and Attention Span: Fair  Recall:  [employer] of Knowledge: Fair  Language: Fair  Akathisia:  No  Handed:  Right  AIMS (if indicated):na  Assets:  Communication Skills Desire for Improvement Social Support  ADL's:  Intact  Cognition: WNL  Sleep:  Fair    Screenings:PHQ 9, GAD 7 PHQ2-9     Office Visit from 01/06/2018 in [facility] Office Visit from 12/27/2017 in [facility]  PHQ-2 Total Score  0  0       Assessment and Plan: Cory [name] is a 22 year old Caucasian male, single, lives in [place], has a history of depression, anxiety, presented to the clinic today for a follow-up visit.  Patient has a long-term history of depression since the age of 26.  He currently is in psychotherapy with therapists Debbe Bales.  He continues to be motivated to stay in treatment.  Discussed medication changes.  Plan as noted below.  Plan MDD Increase  Prozac to 20 mg p.o. daily with breakfast PHQ 9 equals 10  GAD Increase Prozac to 20 mg p.o. daily GAD 7 equals 16  Social anxiety disorder Continue psychotherapy with therapists Debbe Bales.  We will order TSH, provided lab slip.  Follow-up in clinic in 4 weeks or sooner if needed.  More than 50 % of the time was spent for psychoeducation and supportive psychotherapy and care coordination.  This note was generated in part or whole with voice recognition software. Voice recognition is usually quite accurate but there are transcription errors that can and very often do occur. I apologize for any typographical errors that were not detected and corrected.       Ursula Alert, MD 01/27/2018, 8:31 AM

## 2018-01-27 ENCOUNTER — Encounter: Payer: Self-pay | Admitting: Psychiatry

## 2018-01-27 ENCOUNTER — Other Ambulatory Visit: Payer: Self-pay | Admitting: Psychiatry

## 2018-01-28 LAB — TSH: TSH: 2.6 u[IU]/mL (ref 0.450–4.500)

## 2018-02-03 ENCOUNTER — Ambulatory Visit (INDEPENDENT_AMBULATORY_CARE_PROVIDER_SITE_OTHER): Payer: 59 | Admitting: Psychology

## 2018-02-03 DIAGNOSIS — F4010 Social phobia, unspecified: Secondary | ICD-10-CM | POA: Diagnosis not present

## 2018-02-03 DIAGNOSIS — F321 Major depressive disorder, single episode, moderate: Secondary | ICD-10-CM

## 2018-02-03 DIAGNOSIS — F411 Generalized anxiety disorder: Secondary | ICD-10-CM | POA: Diagnosis not present

## 2018-02-17 ENCOUNTER — Ambulatory Visit (INDEPENDENT_AMBULATORY_CARE_PROVIDER_SITE_OTHER): Payer: 59 | Admitting: Psychology

## 2018-02-17 DIAGNOSIS — F411 Generalized anxiety disorder: Secondary | ICD-10-CM | POA: Diagnosis not present

## 2018-02-23 ENCOUNTER — Ambulatory Visit: Payer: 59 | Admitting: Psychiatry

## 2018-03-03 ENCOUNTER — Telehealth: Payer: Self-pay | Admitting: Internal Medicine

## 2018-03-03 NOTE — Telephone Encounter (Signed)
Paperwork has been placed in provider box. 

## 2018-03-03 NOTE — Telephone Encounter (Signed)
Pt's mother dropped off papers that need to be completed by Dr. Marigene Ehlers for pt's college. Please call mother when completed so she can pick up.

## 2018-03-06 ENCOUNTER — Ambulatory Visit (INDEPENDENT_AMBULATORY_CARE_PROVIDER_SITE_OTHER): Payer: 59 | Admitting: Psychology

## 2018-03-06 DIAGNOSIS — F411 Generalized anxiety disorder: Secondary | ICD-10-CM | POA: Diagnosis not present

## 2018-03-09 NOTE — Telephone Encounter (Signed)
Please call patient's mom at [phone] when ready for pick up. It has to be done by tomorrow for orientation

## 2018-03-09 NOTE — Telephone Encounter (Signed)
Please advise 

## 2018-03-10 DIAGNOSIS — Z23 Encounter for immunization: Secondary | ICD-10-CM | POA: Diagnosis not present

## 2018-03-10 DIAGNOSIS — Z0001 Encounter for general adult medical examination with abnormal findings: Secondary | ICD-10-CM | POA: Diagnosis not present

## 2018-03-10 DIAGNOSIS — Z713 Dietary counseling and surveillance: Secondary | ICD-10-CM | POA: Diagnosis not present

## 2018-03-10 NOTE — Telephone Encounter (Signed)
Patient needs labs to fill out form and mom needs to get copy of vaccination record from [place] 2 pages are for her or patient to fill out   I cant fill out my part until he has labs which he has not agreed to last 2 visits   [employer]

## 2018-03-10 NOTE — Telephone Encounter (Signed)
okay so patients mom just clarified, she said that Cory [name] had them completed at a [facility]  Cory [name] should only be picking up a blank form to have updated by other provider that completed immunizations.  she's not wanting Dr. Olivia Mackie to sign off on today. . Patient has taken paperwork to  Peds.

## 2018-03-20 ENCOUNTER — Ambulatory Visit (INDEPENDENT_AMBULATORY_CARE_PROVIDER_SITE_OTHER): Payer: 59 | Admitting: Psychology

## 2018-03-20 DIAGNOSIS — F411 Generalized anxiety disorder: Secondary | ICD-10-CM

## 2018-03-29 ENCOUNTER — Ambulatory Visit (INDEPENDENT_AMBULATORY_CARE_PROVIDER_SITE_OTHER): Payer: 59 | Admitting: Internal Medicine

## 2018-03-29 ENCOUNTER — Encounter: Payer: Self-pay | Admitting: Internal Medicine

## 2018-03-29 VITALS — BP 116/68 | HR 69 | Temp 98.3°F | Ht 68.0 in | Wt 215.8 lb

## 2018-03-29 DIAGNOSIS — F419 Anxiety disorder, unspecified: Secondary | ICD-10-CM | POA: Diagnosis not present

## 2018-03-29 DIAGNOSIS — F4010 Social phobia, unspecified: Secondary | ICD-10-CM

## 2018-03-29 DIAGNOSIS — R519 Headache, unspecified: Secondary | ICD-10-CM

## 2018-03-29 DIAGNOSIS — F411 Generalized anxiety disorder: Secondary | ICD-10-CM

## 2018-03-29 DIAGNOSIS — E66811 Obesity, class 1: Secondary | ICD-10-CM

## 2018-03-29 DIAGNOSIS — F329 Major depressive disorder, single episode, unspecified: Secondary | ICD-10-CM | POA: Diagnosis not present

## 2018-03-29 DIAGNOSIS — L700 Acne vulgaris: Secondary | ICD-10-CM | POA: Diagnosis not present

## 2018-03-29 DIAGNOSIS — R51 Headache: Secondary | ICD-10-CM | POA: Diagnosis not present

## 2018-03-29 DIAGNOSIS — F32A Depression, unspecified: Secondary | ICD-10-CM

## 2018-03-29 DIAGNOSIS — E669 Obesity, unspecified: Secondary | ICD-10-CM | POA: Diagnosis not present

## 2018-03-29 NOTE — Progress Notes (Signed)
Chief Complaint  Patient presents with  . Follow-up   F/u doing well  1. Declines labs today  2. Ha resolved fiorcet did not help and CT head 01/12/18 negative he thinks he had a migraine but does not want to try any further medication  3. Mood. GAD improved and therapy is helping with prozac 20 mg qd  4. Leaving for [employer] 05/2018 interested in criminal justice.  5. Obesity BMI >32 working out 4x per week 1 hour but eating fast foods disc healthy diet choices.   Review of Systems  Constitutional: Negative for weight loss.  HENT: Negative for hearing loss.   Eyes: Negative for blurred vision.  Respiratory: Negative for shortness of breath.   Cardiovascular: Negative for chest pain.  Gastrointestinal: Negative for abdominal pain.  Skin: Negative for rash.       Acne improved   Neurological: Negative for headaches.  Psychiatric/Behavioral:       Depression and anxiety improved    Past Medical History:  Diagnosis Date  . Anxiety   . Asthma   . Depression    Past Surgical History:  Procedure Laterality Date  . ADENOIDECTOMY     2004   Family History  Problem Relation Age of Onset  . Depression Mother   . Hyperlipidemia Mother   . Hypertension Mother   . Anxiety disorder Mother   . Asthma Sister   . Cancer Paternal Grandmother   . Early death Paternal Grandmother   . Cancer Paternal Grandfather    Social History   Socioeconomic History  . Marital status: Single    Spouse name: Not on file  . Number of children: 0  . Years of education: Not on file  . Highest education level: Some college, no degree  Occupational History    Comment: full time student  Social Needs  . Financial resource strain: Not on file  . Food insecurity:    Worry: Not on file    Inability: Not on file  . Transportation needs:    Medical: No    Non-medical: No  Tobacco Use  . Smoking status: Former Smoker    Types: E-cigarettes, Cigarettes  . Smokeless tobacco: Never Used  . Tobacco  comment: tired a couple  times  Substance and Sexual Activity  . Alcohol use: Not Currently  . Drug use: Not Currently  . Sexual activity: Not Currently  Lifestyle  . Physical activity:    Days per week: 4 days    Minutes per session: 30 min  . Stress: Very much  Relationships  . Social connections:    Talks on phone: More than three times a week    Gets together: More than three times a week    Attends religious service: Never    Active member of club or organization: No    Attends meetings of clubs or organizations: Never    Relationship status: Never married  . Intimate partner violence:    Fear of current or ex partner: No    Emotionally abused: No    Physically abused: No    Forced sexual activity: No  Other Topics Concern  . Not on file  Social History Narrative   College at [employer]   Wears seat belt    Safe in relationship    Current Meds  Medication Sig  . Benzoyl Peroxide (BENZOYL PEROXIDE) 10 % LIQD Apply topically at bedtime.  . butalbital-acetaminophen-caffeine (FIORICET, ESGIC) 50-325-40 MG tablet Take 1-2 tablets by mouth every 8 (eight)  hours as needed for headache. Do not take more than 6 pills in 1 day  . FLUoxetine (PROZAC) 20 MG capsule Take 1 capsule (20 mg total) by mouth daily.   No Known Allergies Recent Results (from the past 2160 hour(s))  TSH     Status: None   Collection Time: 01/27/18  1:48 PM  Result Value Ref Range   TSH 2.600 0.450 - 4.500 uIU/mL   Objective  Body mass index is 32.81 kg/m. Wt Readings from Last 3 Encounters:  03/29/18 215 lb 12.8 oz (97.9 kg)  01/06/18 219 lb 9.6 oz (99.6 kg)  12/27/17 216 lb 6.4 oz (98.2 kg)   Temp Readings from Last 3 Encounters:  03/29/18 98.3 F (36.8 C) (Oral)  01/06/18 98.9 F (37.2 C) (Oral)  12/27/17 98.4 F (36.9 C) (Oral)   BP Readings from Last 3 Encounters:  03/29/18 116/68  01/06/18 126/60  12/27/17 118/70   Pulse Readings from Last 3 Encounters:  03/29/18 69  01/06/18 100   12/27/17 68    Physical Exam  Constitutional: He is oriented to person, place, and time. Vital signs are normal. He appears well-developed and well-nourished. He is cooperative.  HENT:  Head: Normocephalic and atraumatic.  Mouth/Throat: Oropharynx is clear and moist and mucous membranes are normal.  Eyes: Pupils are equal, round, and reactive to light. Conjunctivae are normal.  Cardiovascular: Normal rate, regular rhythm and normal heart sounds.  Pulmonary/Chest: Effort normal and breath sounds normal.  Neurological: He is alert and oriented to person, place, and time. Gait normal.  Skin: Skin is warm, dry and intact.  Acne improved   Psychiatric: He has a normal mood and affect. His speech is normal and behavior is normal. Judgment and thought content normal. Cognition and memory are normal.  Nursing note and vitals reviewed.   Assessment   1. H/a resolved ? Migraine no h/o migraines  2. Depression/GAD/social anxiety  3. Obesity >32  4. HM Plan   1. D/c fiorcet does not want to try imitrex for now  CT reviewed neg  2. F/u psych needs to sch f/u no f/u sch  rec seek therapy at [facility] F/u osman here  3. Working out 4x per week 1 hour but eating unhealthy disc healthy diet choicse  4.  Vaccines-see chart scanned in from [facility]  Tdap 03/25/09  Had menveo 07/19/11 and 03/19/15 Had hep B vaccine  Had varicella x 2 doses  Had polio  Had MMR Had Hib Had hep A vaccine   Pt declines labs for now but rec     Provider: Dr. Olivia Mackie McLean-Scocuzza-Internal Medicine

## 2018-03-29 NOTE — Progress Notes (Signed)
Pre visit review using our clinic review tool, if applicable. No additional management support is needed unless otherwise documented below in the visit note. 

## 2018-03-29 NOTE — Patient Instructions (Addendum)
Take care and enjoy college !  Follow up in 6-12 months  Establish a therapist at school and enjoy    Migraine Headache A migraine headache is a very strong throbbing pain on one side or both sides of your head. Migraines can also cause other symptoms. Talk with your doctor about what things may bring on (trigger) your migraine headaches. Follow these instructions at home: Medicines  Take over-the-counter and prescription medicines only as told by your doctor.  Do not drive or use heavy machinery while taking prescription pain medicine.  To prevent or treat constipation while you are taking prescription pain medicine, your doctor may recommend that you: ? Drink enough fluid to keep your pee (urine) clear or pale yellow. ? Take over-the-counter or prescription medicines. ? Eat foods that are high in fiber. These include fresh fruits and vegetables, whole grains, and beans. ? Limit foods that are high in fat and processed sugars. These include fried and sweet foods. Lifestyle  Avoid alcohol.  Do not use any products that contain nicotine or tobacco, such as cigarettes and e-cigarettes. If you need help quitting, ask your doctor.  Get at least 8 hours of sleep every night.  Limit your stress. General instructions   Keep a journal to find out what may bring on your migraines. For example, write down: ? What you eat and drink. ? How much sleep you get. ? Any change in what you eat or drink. ? Any change in your medicines.  If you have a migraine: ? Avoid things that make your symptoms worse, such as bright lights. ? It may help to lie down in a dark, quiet room. ? Do not drive or use heavy machinery. ? Ask your doctor what activities are safe for you.  Keep all follow-up visits as told by your doctor. This is important. Contact a doctor if:  You get a migraine that is different or worse than your usual migraines. Get help right away if:  Your migraine gets very bad.  You  have a fever.  You have a stiff neck.  You have trouble seeing.  Your muscles feel weak or like you cannot control them.  You start to lose your balance a lot.  You start to have trouble walking.  You pass out (faint). This information is not intended to replace advice given to you by your health care provider. Make sure you discuss any questions you have with your health care provider. Document Released: 06/29/2008 Document Revised: 04/09/2016 Document Reviewed: 03/08/2016 Elsevier Interactive Patient Education  2018 [employer].  Acne Acne is a skin problem that causes pimples. Acne occurs when the pores in the skin get blocked. The pores may become infected with bacteria, or they may become red, sore, and swollen. Acne is a common skin problem, especially for teenagers. Acne usually goes away over time. What are the causes? Each pore contains an oil gland. Oil glands make an oily substance that is called sebum. Acne happens when these glands get plugged with sebum, dead skin cells, and dirt. Then, the bacteria that are normally found in the oil glands multiply and cause inflammation. Acne is commonly triggered by changes in your hormones. These hormonal changes can cause the oil glands to get bigger and to make more sebum. Factors that can make acne worse include:  Hormone changes during: ? Adolescence. ? Women's menstrual cycles. ? Pregnancy.  Oil-based cosmetics and hair products.  Harshly scrubbing the skin.  Strong soaps.  Stress.  Hormone problems that are due to certain diseases.  Long or oily hair rubbing against the skin.  Certain medicines.  Pressure from headbands, backpacks, or shoulder pads.  Exposure to certain oils and chemicals.  What increases the risk? This condition is more likely to develop in:  Teenagers.  People who have a family history of acne.  What are the signs or symptoms? Acne often occurs on the face, neck, chest, and upper back.  Symptoms include:  Small, red bumps (pimples or papules).  Whiteheads.  Blackheads.  Small, pus-filled pimples (pustules).  Big, red pimples or pustules that feel tender.  More severe acne can cause:  An infected area that contains a collection of pus (abscess).  Hard, painful, fluid-filled sacs (cysts).  Scars.  How is this diagnosed? This condition is diagnosed with a medical history and physical exam. Blood tests may also be done. How is this treated? Treatment for this condition can vary depending on the severity of your acne. Treatment may include:  Creams and lotions that prevent oil glands from clogging.  Creams and lotions that treat or prevent infections and inflammation.  Antibiotic medicines that are applied to the skin or taken as a pill.  Pills that decrease sebum production.  Birth control pills.  Light or laser treatments.  Surgery.  Injections of medicine into the affected areas.  Chemicals that cause peeling of the skin.  Your health care provider will also recommend the best way to take care of your skin. Good skin care is the most important part of treatment. Follow these instructions at home: Skin care Take care of your skin as told by your health care provider. You may be told to do these things:  Wash your skin gently at least two times each day, as well as: ? After you exercise. ? Before you go to bed.  Use mild soap.  Apply a water-based skin moisturizer after you wash your skin.  Use a sunscreen or sunblock with SPF 30 or greater. This is especially important if you are using acne medicines.  Choose cosmetics that will not plug your oil glands (are noncomedogenic).  Medicines  Take over-the-counter and prescription medicines only as told by your health care provider.  If you were prescribed an antibiotic medicine, apply or take it as told by your health care provider. Do not stop taking the antibiotic even if your condition  improves. General instructions  Keep your hair clean and off of your face. If you have oily hair, shampoo your hair regularly or daily.  Avoid leaning your chin or forehead against your hands.  Avoid wearing tight headbands or hats.  Avoid picking or squeezing your pimples. That can make your acne worse and cause scarring.  Keep all follow-up visits as told by your health care provider. This is important.  Shave gently and only when necessary.  Keep a food journal to figure out if any foods are linked with your acne. Contact a health care provider if:  Your acne is not better after eight weeks.  Your acne gets worse.  You have a large area of skin that is red or tender.  You think that you are having side effects from any acne medicine. This information is not intended to replace advice given to you by your health care provider. Make sure you discuss any questions you have with your health care provider. Document Released: 09/17/2000 Document Revised: 05/21/2016 Document Reviewed: 11/27/2014 Elsevier Interactive Patient Education  [employer].

## 2018-04-03 ENCOUNTER — Ambulatory Visit (INDEPENDENT_AMBULATORY_CARE_PROVIDER_SITE_OTHER): Payer: 59 | Admitting: Psychology

## 2018-04-03 DIAGNOSIS — F321 Major depressive disorder, single episode, moderate: Secondary | ICD-10-CM

## 2018-04-04 ENCOUNTER — Other Ambulatory Visit: Payer: Self-pay

## 2018-04-04 ENCOUNTER — Encounter: Payer: Self-pay | Admitting: Psychiatry

## 2018-04-04 ENCOUNTER — Ambulatory Visit (INDEPENDENT_AMBULATORY_CARE_PROVIDER_SITE_OTHER): Payer: 59 | Admitting: Psychiatry

## 2018-04-04 VITALS — BP 123/73 | HR 69 | Temp 98.1°F | Wt 213.6 lb

## 2018-04-04 DIAGNOSIS — F330 Major depressive disorder, recurrent, mild: Secondary | ICD-10-CM | POA: Diagnosis not present

## 2018-04-04 DIAGNOSIS — F411 Generalized anxiety disorder: Secondary | ICD-10-CM | POA: Diagnosis not present

## 2018-04-04 DIAGNOSIS — F4010 Social phobia, unspecified: Secondary | ICD-10-CM | POA: Diagnosis not present

## 2018-04-04 MED ORDER — FLUOXETINE HCL 40 MG PO CAPS: 40.0000 mg | ORAL_CAPSULE | Freq: Every day | ORAL | 1 refills | Status: DC

## 2018-04-04 MED ORDER — FLUOXETINE HCL 20 MG PO CAPS: 20.0000 mg | ORAL_CAPSULE | Freq: Every day | ORAL | 0 refills | Status: DC

## 2018-04-04 NOTE — Progress Notes (Signed)
[place] MD OP Progress Note  04/04/2018 12:25 PM Cory [name]  MRN:  [id number]  Chief Complaint: ' I am here for follow up." Chief Complaint    Follow-up; Medication Refill     HPI: Cory [name] is a 22 year old Caucasian male, single, lives in [place], student at [employer], has a history of depression, anxiety, presented to the clinic today for a follow-up visit.  Patient today reports he has been noncompliant with his Prozac.  His mother who presented along with patient also provided collateral information.  As per mother patient has been forgetting to take his Prozac.  He was also worried about side effects of Prozac and hence stopped taking it few weeks ago.  According to mother patient's depression and anxiety seems to be getting worse.  She reports she is worried about him getting even worse if he does not restart his medications.  Per mother patient has tried medications like Lexapro in the past which worked for a while and he was doing well on the same.  However he wanted his Lexapro to be changed since he felt that it stopped working after he took it for a while.  Patient was evaluated by himself while his mother waited outside during part of the session.  Patient reports he would like to retry the Prozac since he did not give it enough time.  Patient denies any significant side effects but reports he had some trouble with compliance.  Provided education about being compliant on his medication.  Discussed with him that since he did not develop any side effects his Prozac dosage can be increased.  He agrees with plan.  Patient denies any suicidality.  Patient denies any perceptual disturbances.  Patient reports sleep is restless however he reports he wants to continue him melatonin and does not want any sleep aids at this time.  Patient will be going to college in [place] in August.  Discussed with him to come back to clinic beginning of August before he leaves for college. Visit Diagnosis:   ICD-10-CM   1. GAD (generalized anxiety disorder) F41.1   2. Social anxiety disorder F40.10   3. MDD (major depressive disorder), recurrent episode, mild ([place]) F33.0     Past Psychiatric History: Have reviewed past psychiatric history from my progress note on 01/05/2018.  Past trials of Lexapro, Zoloft, Strattera.  Past Medical History:  Past Medical History:  Diagnosis Date  . Anxiety    social and GAD  . Asthma   . Depression     Past Surgical History:  Procedure Laterality Date  . ADENOIDECTOMY     2004    Family Psychiatric History: I have reviewed family psychiatric history from my progress note on 01/05/2018. Family History:  Family History  Problem Relation Age of Onset  . Depression Mother   . Hyperlipidemia Mother   . Hypertension Mother   . Anxiety disorder Mother   . Asthma Sister   . Cancer Paternal Grandmother   . Early death Paternal Grandmother   . Cancer Paternal Grandfather    Substance abuse history: Denies  Social History: I have reviewed social history from my progress note on 01/05/2018 Social History   Socioeconomic History  . Marital status: Single    Spouse name: Not on file  . Number of children: 0  . Years of education: Not on file  . Highest education level: Some college, no degree  Occupational History    Comment: full time student  Social Needs  . Emergency planning/management officer  strain: Not on file  . Food insecurity:    Worry: Not on file    Inability: Not on file  . Transportation needs:    Medical: No    Non-medical: No  Tobacco Use  . Smoking status: Former Smoker    Types: E-cigarettes, Cigarettes  . Smokeless tobacco: Never Used  . Tobacco comment: tired a couple  times  Substance and Sexual Activity  . Alcohol use: Not Currently  . Drug use: Not Currently  . Sexual activity: Not Currently  Lifestyle  . Physical activity:    Days per week: 4 days    Minutes per session: 30 min  . Stress: Very much  Relationships  . Social  connections:    Talks on phone: More than three times a week    Gets together: More than three times a week    Attends religious service: Never    Active member of club or organization: No    Attends meetings of clubs or organizations: Never    Relationship status: Never married  Other Topics Concern  . Not on file  Social History Narrative   College at [employer]   Wears seat belt    Safe in relationship     Allergies: No Known Allergies  Metabolic Disorder Labs: No results found for: HGBA1C, MPG No results found for: PROLACTIN No results found for: CHOL, TRIG, HDL, CHOLHDL, VLDL, LDLCALC Lab Results  Component Value Date   TSH 2.600 01/27/2018   TSH 3.42 01/20/2012    Therapeutic Level Labs: No results found for: LITHIUM No results found for: VALPROATE No components found for:  CBMZ  Current Medications: Current Outpatient Medications  Medication Sig Dispense Refill  . Benzoyl Peroxide (BENZOYL PEROXIDE) 10 % LIQD Apply topically at bedtime.    Marland Kitchen FLUoxetine (PROZAC) 20 MG capsule Take 1 capsule (20 mg total) by mouth daily. 15 capsule 0  . [START ON 04/17/2018] FLUoxetine (PROZAC) 40 MG capsule Take 1 capsule (40 mg total) by mouth daily. Start taking in 2 weeks . Stop 20 mg when you start this dose. 30 capsule 1   No current facility-administered medications for this visit.      Musculoskeletal: Strength & Muscle Tone: within normal limits Gait & Station: normal Patient leans: N/A  Psychiatric Specialty Exam: Review of Systems  Psychiatric/Behavioral: Positive for depression. The patient is nervous/anxious and has insomnia.   All other systems reviewed and are negative.   Blood pressure 123/73, pulse 69, temperature 98.1 F (36.7 C), temperature source Oral, weight 213 lb 9.6 oz (96.9 kg).Body mass index is 32.48 kg/m.  General Appearance: Casual  Eye Contact:  Fair  Speech:  Normal Rate  Volume:  Normal  Mood:  Dysphoric  Affect:  Congruent  Thought Process:   Goal Directed and Descriptions of Associations: Intact  Orientation:  Full (Time, Place, and Person)  Thought Content: Logical   Suicidal Thoughts:  No  Homicidal Thoughts:  No  Memory:  Immediate;   Fair Recent;   Fair Remote;   Fair  Judgement:  Fair  Insight:  Fair  Psychomotor Activity:  Normal  Concentration:  Concentration: Fair and Attention Span: Fair  Recall:  [employer] of Knowledge: Fair  Language: Fair  Akathisia:  No  Handed:  Right  AIMS (if indicated): na  Assets:  Communication Skills Desire for Improvement Social Support  ADL's:  Intact  Cognition: WNL  Sleep:  Poor   Screenings: PHQ2-9     Office Visit from  01/06/2018 in [facility] Office Visit from 12/27/2017 in [facility]  PHQ-2 Total Score  0  0       Assessment and Plan: Tommie is a 22 year old Caucasian male, single, lives in [place], has a history of depression, anxiety, presented to the clinic today for a follow-up visit.  Patient has a long-term history of depression since the age of 87.  He continues to be in psychotherapy with therapist Debbe Bales.  He has been noncompliant with his Prozac however would like to restart it.  Plan as noted below.  Plan MDD  Continue Prozac 20 mg p.o. daily with breakfast for 2 weeks and then increase to 40 mg p.o. daily. Patient provided with 2 prescriptions ,one for 20 mg and the other one for 40 mg.  GAD Prozac increased to 40 mg in 2 weeks.  For now he will continue 20 mg p.o. daily. Provided education about being compliant on his medications.  For social anxiety disorder Continue psychotherapy with therapist Debbe Bales.  TSH-reviewed-26 2019-within normal limits.  Provided information about genesight testing.  Follow up in clinic in 5 weeks.  More than 50 % of the time was spent for psychoeducation and supportive psychotherapy and care coordination.  This note was generated in part or whole with voice  recognition software. Voice recognition is usually quite accurate but there are transcription errors that can and very often do occur. I apologize for any typographical errors that were not detected and corrected.       Ursula Alert, MD 04/05/2018, 1:46 PM

## 2018-04-05 ENCOUNTER — Encounter: Payer: Self-pay | Admitting: Psychiatry

## 2018-04-09 ENCOUNTER — Other Ambulatory Visit: Payer: Self-pay | Admitting: Psychiatry

## 2018-04-09 DIAGNOSIS — F411 Generalized anxiety disorder: Secondary | ICD-10-CM

## 2018-04-10 ENCOUNTER — Ambulatory Visit (INDEPENDENT_AMBULATORY_CARE_PROVIDER_SITE_OTHER): Payer: 59 | Admitting: Psychology

## 2018-04-10 DIAGNOSIS — F411 Generalized anxiety disorder: Secondary | ICD-10-CM | POA: Diagnosis not present

## 2018-04-18 ENCOUNTER — Ambulatory Visit: Payer: 59 | Admitting: Psychology

## 2018-04-21 ENCOUNTER — Ambulatory Visit (INDEPENDENT_AMBULATORY_CARE_PROVIDER_SITE_OTHER): Payer: 59 | Admitting: Psychology

## 2018-04-21 DIAGNOSIS — F411 Generalized anxiety disorder: Secondary | ICD-10-CM

## 2018-05-05 ENCOUNTER — Ambulatory Visit (INDEPENDENT_AMBULATORY_CARE_PROVIDER_SITE_OTHER): Payer: 59 | Admitting: Psychology

## 2018-05-05 DIAGNOSIS — F411 Generalized anxiety disorder: Secondary | ICD-10-CM | POA: Diagnosis not present

## 2018-05-08 ENCOUNTER — Ambulatory Visit: Payer: 59 | Admitting: Psychiatry

## 2018-05-09 ENCOUNTER — Ambulatory Visit (INDEPENDENT_AMBULATORY_CARE_PROVIDER_SITE_OTHER): Payer: 59 | Admitting: Psychology

## 2018-05-09 DIAGNOSIS — F411 Generalized anxiety disorder: Secondary | ICD-10-CM | POA: Diagnosis not present

## 2018-05-16 ENCOUNTER — Ambulatory Visit (INDEPENDENT_AMBULATORY_CARE_PROVIDER_SITE_OTHER): Payer: 59 | Admitting: Psychology

## 2018-05-16 DIAGNOSIS — R51 Headache: Secondary | ICD-10-CM | POA: Diagnosis not present

## 2018-05-16 DIAGNOSIS — F411 Generalized anxiety disorder: Secondary | ICD-10-CM | POA: Diagnosis not present

## 2018-05-16 DIAGNOSIS — K5909 Other constipation: Secondary | ICD-10-CM | POA: Diagnosis not present

## 2018-06-07 DIAGNOSIS — R140 Abdominal distension (gaseous): Secondary | ICD-10-CM | POA: Diagnosis not present

## 2018-06-07 DIAGNOSIS — R12 Heartburn: Secondary | ICD-10-CM | POA: Diagnosis not present

## 2018-06-07 DIAGNOSIS — K2960 Other gastritis without bleeding: Secondary | ICD-10-CM | POA: Diagnosis not present

## 2018-06-07 DIAGNOSIS — K5900 Constipation, unspecified: Secondary | ICD-10-CM | POA: Diagnosis not present

## 2018-06-08 ENCOUNTER — Ambulatory Visit (INDEPENDENT_AMBULATORY_CARE_PROVIDER_SITE_OTHER): Payer: 59 | Admitting: Psychiatry

## 2018-06-08 ENCOUNTER — Other Ambulatory Visit: Payer: Self-pay

## 2018-06-08 ENCOUNTER — Encounter: Payer: Self-pay | Admitting: Psychiatry

## 2018-06-08 VITALS — BP 135/83 | HR 66 | Temp 97.7°F | Wt 213.4 lb

## 2018-06-08 DIAGNOSIS — F411 Generalized anxiety disorder: Secondary | ICD-10-CM

## 2018-06-08 DIAGNOSIS — F4010 Social phobia, unspecified: Secondary | ICD-10-CM

## 2018-06-08 DIAGNOSIS — F330 Major depressive disorder, recurrent, mild: Secondary | ICD-10-CM

## 2018-06-08 MED ORDER — FLUOXETINE HCL 20 MG PO CAPS: 20.0000 mg | ORAL_CAPSULE | Freq: Every day | ORAL | 1 refills | Status: DC

## 2018-06-08 NOTE — Progress Notes (Signed)
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  Patient has a history of depression and anxiety.  Patient today reports he continues to take Prozac 20 mg and has not increased the dose yet to 40 mg.  He reports he started feeling better on the 20 and hence decided to be on the same dosage.  Patient reports his depressive symptoms as improving.  He reports his appetite is improving.  He denies any sleep issues.  He denies any side effects to the Prozac.  He does report some anxiety symptoms on and off.  He reports it may also have to do with going to the new school.  He reports he has noticed some racing heart rate and nervousness since he is currently at a new school. He reports it gets better when he is back home.  Discussed adding a medication as needed to help with his anxiety symptoms.  Also discussed continuing psychotherapy visits.  Now that he is away in [place] he is unable to follow-up with his therapist Debbe Bales.  He reports he is trying to find a new therapist there.  He  he denies any suicidality.  Denies any perceptual disturbances.  He denies any other concerns today. Visit Diagnosis:    ICD-10-CM   1. GAD (generalized anxiety disorder) F41.1   2. Social anxiety disorder F40.10   3. MDD (major depressive disorder), recurrent episode, mild ([place]) F33.0     Past Psychiatric History: I have reviewed past psychiatric history from my progress note on 01/05/2018.  Past trials of Lexapro, Zoloft, Strattera.  Past Medical History:  Past Medical History:  Diagnosis Date  . Anxiety    social and GAD  . Asthma   . Depression     Past Surgical History:  Procedure Laterality Date  .  ADENOIDECTOMY     2004    Family Psychiatric History: Reviewed family psychiatric history from my progress note on 01/05/2018  Family History:  Family History  Problem Relation Age of Onset  . Depression Mother   . Hyperlipidemia Mother   . Hypertension Mother   . Anxiety disorder Mother   . Asthma Sister   . Cancer Paternal Grandmother   . Early death Paternal Grandmother   . Cancer Paternal Grandfather     Social History: Reviewed social history from my progress note on 01/05/2018. Social History   Socioeconomic History  . Marital status: Single    Spouse name: Not on file  . Number of children: 0  . Years of education: Not on file  . Highest education level: Some college, no degree  Occupational History    Comment: full time student  Social Needs  . Financial resource strain: Not on file  . Food insecurity:    Worry: Not on file    Inability: Not on file  . Transportation needs:    Medical: No    Non-medical: No  Tobacco Use  . Smoking status: Former Smoker    Types: E-cigarettes, Cigarettes  . Smokeless tobacco: Never Used  . Tobacco comment: tired a couple  times  Substance and Sexual Activity  . Alcohol use: Not Currently  . Drug use: Not Currently  .  Sexual activity: Not Currently  Lifestyle  . Physical activity:    Days per week: 4 days    Minutes per session: 30 min  . Stress: Very much  Relationships  . Social connections:    Talks on phone: More than three times a week    Gets together: More than three times a week    Attends religious service: Never    Active member of club or organization: No    Attends meetings of clubs or organizations: Never    Relationship status: Never married  Other Topics Concern  . Not on file  Social History Narrative   College at [employer]   Wears seat belt    Safe in relationship     Allergies: No Known Allergies  Metabolic Disorder Labs: No results found for: HGBA1C, MPG No results found for: PROLACTIN No results  found for: CHOL, TRIG, HDL, CHOLHDL, VLDL, LDLCALC Lab Results  Component Value Date   TSH 2.600 01/27/2018   TSH 3.42 01/20/2012    Therapeutic Level Labs: No results found for: LITHIUM No results found for: VALPROATE No components found for:  CBMZ  Current Medications: Current Outpatient Medications  Medication Sig Dispense Refill  . Benzoyl Peroxide (BENZOYL PEROXIDE) 10 % LIQD Apply topically at bedtime.    Marland Kitchen FLUoxetine (PROZAC) 20 MG capsule Take 1 capsule (20 mg total) by mouth daily. 90 capsule 1   No current facility-administered medications for this visit.      Musculoskeletal: Strength & Muscle Tone: within normal limits Gait & Station: normal Patient leans: N/A  Psychiatric Specialty Exam: Review of Systems  Psychiatric/Behavioral: Positive for depression.  All other systems reviewed and are negative.   Blood pressure 135/83, pulse 66, temperature 97.7 F (36.5 C), temperature source Oral, weight 213 lb 6.4 oz (96.8 kg).Body mass index is 32.45 kg/m.  General Appearance: Casual  Eye Contact:  Fair  Speech:  Clear and Coherent  Volume:  Normal  Mood:  Dysphoric improving  Affect:  Congruent  Thought Process:  Goal Directed and Descriptions of Associations: Intact  Orientation:  Full (Time, Place, and Person)  Thought Content: Logical   Suicidal Thoughts:  No  Homicidal Thoughts:  No  Memory:  Immediate;   Fair Recent;   Fair Remote;   Fair  Judgement:  Fair  Insight:  Fair  Psychomotor Activity:  Normal  Concentration:  Concentration: Fair and Attention Span: Fair  Recall:  [employer] of Knowledge: Fair  Language: Fair  Akathisia:  No  Handed:  Right  AIMS (if indicated): na  Assets:  Communication Skills Desire for Improvement Social Support  ADL's:  Intact  Cognition: WNL  Sleep:  Fair   Screenings: PHQ2-9     Office Visit from 01/06/2018 in [facility] Office Visit from 12/27/2017 in [facility]   PHQ-2 Total Score  0  0       Assessment and Plan: Kamin is a 22 year old Caucasian male, single, lives in [place], has a history of depression, anxiety, presented to the clinic today for a follow-up visit.  Patient reports he continues to take Prozac 20 mg with improvement in his depressive symptoms.  He does have some anxiety symptoms on and off now that he is at a new school.  Discussed continuing psychotherapy sessions as well as discussed medications as noted below.  Plan as noted below.  Plan MDD Continue Prozac 20 mg p.o. daily. PHQ 9 equals 7  For GAD Prozac 20  mg p.o. Daily GAD 7 equals 10  For social anxiety disorder He will continue psychotherapy-he reports he wants to find a new therapist on campus.  Follow-up in clinic in 2-3 months or sooner if needed.  More than 50 % of the time was spent for psychoeducation and supportive psychotherapy and care coordination.  This note was generated in part or whole with voice recognition software. Voice recognition is usually quite accurate but there are transcription errors that can and very often do occur. I apologize for any typographical errors that were not detected and corrected.         Ursula Alert, MD 06/08/2018, 5:28 PM

## 2018-07-17 DIAGNOSIS — R05 Cough: Secondary | ICD-10-CM | POA: Diagnosis not present

## 2018-07-17 DIAGNOSIS — H6691 Otitis media, unspecified, right ear: Secondary | ICD-10-CM | POA: Diagnosis not present

## 2018-07-17 DIAGNOSIS — J029 Acute pharyngitis, unspecified: Secondary | ICD-10-CM | POA: Diagnosis not present

## 2018-09-07 ENCOUNTER — Ambulatory Visit: Payer: 59 | Admitting: Psychiatry

## 2018-09-18 ENCOUNTER — Encounter: Payer: Self-pay | Admitting: Psychiatry

## 2018-09-18 ENCOUNTER — Ambulatory Visit (INDEPENDENT_AMBULATORY_CARE_PROVIDER_SITE_OTHER): Payer: 59 | Admitting: Psychiatry

## 2018-09-18 ENCOUNTER — Other Ambulatory Visit: Payer: Self-pay

## 2018-09-18 VITALS — BP 122/77 | HR 80 | Temp 98.1°F | Wt 217.0 lb

## 2018-09-18 DIAGNOSIS — F4010 Social phobia, unspecified: Secondary | ICD-10-CM | POA: Diagnosis not present

## 2018-09-18 DIAGNOSIS — F411 Generalized anxiety disorder: Secondary | ICD-10-CM

## 2018-09-18 DIAGNOSIS — F330 Major depressive disorder, recurrent, mild: Secondary | ICD-10-CM

## 2018-09-18 NOTE — Progress Notes (Signed)
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  Today reports he stopped taking his Prozac since he had some side effects.  He reports he had some racing heart rate which has improved since stopping the medication.  He reports he continues to have some on and off anxiety symptoms, mostly social anxiety.  He reports sleep as fair.  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  He however does not want any medications at this time and would like to get his GeneSight testing done before trying any new medications.  He continues to work with his therapist at [employer].  He reports he sees his therapist every 2 weeks or so and reports therapy is going well.  Patient denies any suicidality.  Patient denies any homicidality.  Patient denies any perceptual disturbances.  Patient reports he is doing well academically.  He reports he has had good grades so far.  He is currently on his Christmas break.   Visit Diagnosis:    ICD-10-CM   1. GAD (generalized anxiety disorder) F41.1   2. Social anxiety disorder F40.10   3. MDD (major depressive disorder), recurrent episode, mild ([place]) F33.0     Past Psychiatric History:   Reviewed past psychiatric history from my progress note on 01/05/2018.  Past trials of Lexapro, Zoloft, Strattera.     Past Medical History:  Past Medical History:  Diagnosis Date  . Anxiety    social and GAD  . Asthma   . Depression     Past Surgical History:  Procedure Laterality Date  . ADENOIDECTOMY     2004    Family Psychiatric History: I have reviewed family psychiatric history from my progress note on  01/05/2018.  Family History:  Family History  Problem Relation Age of Onset  . Depression Mother   . Hyperlipidemia Mother   . Hypertension Mother   . Anxiety disorder Mother   . Asthma Sister   . Cancer Paternal Grandmother   . Early death Paternal Grandmother   . Cancer Paternal Grandfather     Social History: Reviewed social history from my progress note on 01/05/2018 Social History   Socioeconomic History  . Marital status: Single    Spouse name: Not on file  . Number of children: 0  . Years of education: Not on file  . Highest education level: Some college, no degree  Occupational History    Comment: full time student  Social Needs  . Financial resource strain: Not on file  . Food insecurity:    Worry: Not on file    Inability: Not on file  . Transportation needs:    Medical: No    Non-medical: No  Tobacco Use  . Smoking status: Former Smoker    Types: E-cigarettes, Cigarettes  . Smokeless tobacco: Never Used  . Tobacco comment: tired a couple  times  Substance and Sexual Activity  . Alcohol use: Not Currently  . Drug use: Not Currently  . Sexual activity: Not Currently  Lifestyle  . Physical activity:    Days per week: 4 days    Minutes per session: 30 min  .  Stress: Very much  Relationships  . Social connections:    Talks on phone: More than three times a week    Gets together: More than three times a week    Attends religious service: Never    Active member of club or organization: No    Attends meetings of clubs or organizations: Never    Relationship status: Never married  Other Topics Concern  . Not on file  Social History Narrative   College at [employer]   Wears seat belt    Safe in relationship     Allergies: No Known Allergies  Metabolic Disorder Labs: No results found for: HGBA1C, MPG No results found for: PROLACTIN No results found for: CHOL, TRIG, HDL, CHOLHDL, VLDL, LDLCALC Lab Results  Component Value Date   TSH 2.600 01/27/2018   TSH  3.42 01/20/2012    Therapeutic Level Labs: No results found for: LITHIUM No results found for: VALPROATE No components found for:  CBMZ  Current Medications: Current Outpatient Medications  Medication Sig Dispense Refill  . Benzoyl Peroxide (BENZOYL PEROXIDE) 10 % LIQD Apply topically at bedtime.     No current facility-administered medications for this visit.      Musculoskeletal: Strength & Muscle Tone: within normal limits Gait & Station: normal Patient leans: N/A  Psychiatric Specialty Exam: Review of Systems  Psychiatric/Behavioral: The patient is nervous/anxious.   All other systems reviewed and are negative.   Blood pressure 122/77, pulse 80, temperature 98.1 F (36.7 C), temperature source Oral, weight 217 lb (98.4 kg).Body mass index is 32.99 kg/m.  General Appearance: Casual  Eye Contact:  Fair  Speech:  Normal Rate  Volume:  Normal  Mood:  Anxious  Affect:  Congruent  Thought Process:  Goal Directed and Descriptions of Associations: Intact  Orientation:  Full (Time, Place, and Person)  Thought Content: Logical   Suicidal Thoughts:  No  Homicidal Thoughts:  No  Memory:  Immediate;   Fair Recent;   Fair Remote;   Fair  Judgement:  Fair  Insight:  Fair  Psychomotor Activity:  Normal  Concentration:  Concentration: Fair and Attention Span: Fair  Recall:  [employer] of Knowledge: Fair  Language: Fair  Akathisia:  No  Handed:  Right  AIMS (if indicated): denies tremors, rigidity  Assets:  Communication Skills Desire for Improvement Social Support  ADL's:  Intact  Cognition: WNL  Sleep:  Fair   Screenings: PHQ2-9     Office Visit from 01/06/2018 in [facility] Office Visit from 12/27/2017 in [facility]  PHQ-2 Total Score  0  0       Assessment and Plan: Cory [name] is a 22 -year-old Caucasian male, single, lives in [place], has a history of depression, anxiety, presented to the clinic today for a follow-up  visit.  Pt is noncompliant with his Prozac.  Patient continues to struggle with anxiety symptoms.  He denies any significant depressive symptoms at this time.  He is currently in psychotherapy sessions which is going well.  Patient is interested in genetic-GeneSight testing today.  Will refer him for the same.  Plan as noted below.  Plan MDD Discontinue Prozac for noncompliance. Will refer patient for GeneSight testing.  For GAD Continue psychotherapy.  For social anxiety disorder Continue psychotherapy sessions with his therapist at [facility].  Patient reports he is not interested in starting a new antidepressant/antianxiety agent yet.  He wants to get GeneSight testing done prior to trying new medications.  Follow-up in clinic in 2 weeks or sooner.  More than 50 % of the time was spent for psychoeducation and supportive psychotherapy and care coordination.  This note was generated in part or whole with voice recognition software. Voice recognition is usually quite accurate but there are transcription errors that can and very often do occur. I apologize for any typographical errors that were not detected and corrected.          Ursula Alert, MD 09/18/2018, 5:01 PM

## 2018-09-19 ENCOUNTER — Other Ambulatory Visit: Payer: Self-pay

## 2018-09-19 ENCOUNTER — Ambulatory Visit (INDEPENDENT_AMBULATORY_CARE_PROVIDER_SITE_OTHER): Payer: 59

## 2018-09-19 VITALS — BP 122/77 | HR 80 | Temp 98.1°F | Ht 68.0 in | Wt 217.0 lb

## 2018-09-19 DIAGNOSIS — F4010 Social phobia, unspecified: Secondary | ICD-10-CM

## 2018-09-19 DIAGNOSIS — F411 Generalized anxiety disorder: Secondary | ICD-10-CM

## 2018-09-19 DIAGNOSIS — F329 Major depressive disorder, single episode, unspecified: Secondary | ICD-10-CM

## 2018-09-19 DIAGNOSIS — F32A Depression, unspecified: Secondary | ICD-10-CM

## 2018-09-19 DIAGNOSIS — F419 Anxiety disorder, unspecified: Secondary | ICD-10-CM

## 2018-09-19 NOTE — Progress Notes (Unsigned)
Pt came into office today to have swab done for gene testing.

## 2018-10-02 ENCOUNTER — Ambulatory Visit (INDEPENDENT_AMBULATORY_CARE_PROVIDER_SITE_OTHER): Payer: 59 | Admitting: Psychiatry

## 2018-10-02 ENCOUNTER — Other Ambulatory Visit: Payer: Self-pay

## 2018-10-02 ENCOUNTER — Encounter: Payer: Self-pay | Admitting: Psychiatry

## 2018-10-02 VITALS — BP 150/82 | HR 94 | Temp 98.1°F | Wt 220.0 lb

## 2018-10-02 DIAGNOSIS — F411 Generalized anxiety disorder: Secondary | ICD-10-CM | POA: Diagnosis not present

## 2018-10-02 DIAGNOSIS — F4010 Social phobia, unspecified: Secondary | ICD-10-CM | POA: Diagnosis not present

## 2018-10-02 DIAGNOSIS — F330 Major depressive disorder, recurrent, mild: Secondary | ICD-10-CM

## 2018-10-02 MED ORDER — BUSPIRONE HCL 7.5 MG PO TABS: 7.5000 mg | ORAL_TABLET | Freq: Two times a day (BID) | ORAL | 0 refills | Status: DC

## 2018-10-02 MED ORDER — HYDROXYZINE PAMOATE 25 MG PO CAPS: 25.0000 mg | ORAL_CAPSULE | Freq: Three times a day (TID) | ORAL | 1 refills | Status: DC | PRN

## 2018-10-02 NOTE — Patient Instructions (Signed)
Buspirone tablets What is this medicine? BUSPIRONE (byoo SPYE rone) is used to treat anxiety disorders. This medicine may be used for other purposes; ask your health care provider or pharmacist if you have questions. COMMON BRAND NAME(S): BuSpar What should I tell my health care provider before I take this medicine? They need to know if you have any of these conditions: -kidney or liver disease -an unusual or allergic reaction to buspirone, other medicines, foods, dyes, or preservatives -pregnant or trying to get pregnant -breast-feeding How should I use this medicine? Take this medicine by mouth with a glass of water. Follow the directions on the prescription label. You may take this medicine with or without food. To ensure that this medicine always works the same way for you, you should take it either always with or always without food. Take your doses at regular intervals. Do not take your medicine more often than directed. Do not stop taking except on the advice of your doctor or health care professional. Talk to your pediatrician regarding the use of this medicine in children. Special care may be needed. Overdosage: If you think you have taken too much of this medicine contact a poison control center or emergency room at once. NOTE: This medicine is only for you. Do not share this medicine with others. What if I miss a dose? If you miss a dose, take it as soon as you can. If it is almost time for your next dose, take only that dose. Do not take double or extra doses. What may interact with this medicine? Do not take this medicine with any of the following medications: -linezolid -MAOIs like Carbex, Eldepryl, Marplan, Nardil, and Parnate -methylene blue -procarbazine This medicine may also interact with the following medications: -diazepam -digoxin -diltiazem -erythromycin -grapefruit juice -haloperidol -medicines for mental depression or mood problems -medicines for seizures like  carbamazepine, phenobarbital and phenytoin -nefazodone -other medications for anxiety -rifampin -ritonavir -some antifungal medicines like itraconazole, ketoconazole, and voriconazole -verapamil -warfarin This list may not describe all possible interactions. Give your health care provider a list of all the medicines, herbs, non-prescription drugs, or dietary supplements you use. Also tell them if you smoke, drink alcohol, or use illegal drugs. Some items may interact with your medicine. What should I watch for while using this medicine? Visit your doctor or health care professional for regular checks on your progress. It may take 1 to 2 weeks before your anxiety gets better. You may get drowsy or dizzy. Do not drive, use machinery, or do anything that needs mental alertness until you know how this drug affects you. Do not stand or sit up quickly, especially if you are an older patient. This reduces the risk of dizzy or fainting spells. Alcohol can make you more drowsy and dizzy. Avoid alcoholic drinks. What side effects may I notice from receiving this medicine? Side effects that you should report to your doctor or health care professional as soon as possible: -blurred vision or other vision changes -chest pain -confusion -difficulty breathing -feelings of hostility or anger -muscle aches and pains -numbness or tingling in hands or feet -ringing in the ears -skin rash and itching -vomiting -weakness Side effects that usually do not require medical attention (report to your doctor or health care professional if they continue or are bothersome): -disturbed dreams, nightmares -headache -nausea -restlessness or nervousness -sore throat and nasal congestion -stomach upset This list may not describe all possible side effects. Call your doctor for medical advice about side   effects. You may report side effects to FDA at 1-800-FDA-1088. Where should I keep my medicine? Keep out of the reach  of children. Store at room temperature below 30 degrees C (86 degrees F). Protect from light. Keep container tightly closed. Throw away any unused medicine after the expiration date. NOTE: This sheet is a summary. It may not cover all possible information. If you have questions about this medicine, talk to your doctor, pharmacist, or health care provider.  2019 Elsevier/Gold Standard (2010-04-30 18:06:11) Hydroxyzine capsules or tablets What is this medicine? HYDROXYZINE (hye [place] i zeen) is an antihistamine. This medicine is used to treat allergy symptoms. It is also used to treat anxiety and tension. This medicine can be used with other medicines to induce sleep before surgery. This medicine may be used for other purposes; ask your health care provider or pharmacist if you have questions. COMMON BRAND NAME(S): ANX, Atarax, Rezine, Vistaril What should I tell my health care provider before I take this medicine? They need to know if you have any of these conditions: -glaucoma -heart disease -history of irregular heartbeat -kidney disease -liver disease -lung or breathing disease, like asthma -stomach or intestine problems -thyroid disease -trouble passing urine -an unusual or allergic reaction to hydroxyzine, cetirizine, other medicines, foods, dyes or preservatives -pregnant or trying to get pregnant -breast-feeding How should I use this medicine? Take this medicine by mouth with a full glass of water. Follow the directions on the prescription label. You may take this medicine with food or on an empty stomach. Take your medicine at regular intervals. Do not take your medicine more often than directed. Talk to your pediatrician regarding the use of this medicine in children. Special care may be needed. While this drug may be prescribed for children as young as 57 years of age for selected conditions, precautions do apply. Patients over 49 years old may have a stronger reaction and need a  smaller dose. Overdosage: If you think you have taken too much of this medicine contact a poison control center or emergency room at once. NOTE: This medicine is only for you. Do not share this medicine with others. What if I miss a dose? If you miss a dose, take it as soon as you can. If it is almost time for your next dose, take only that dose. Do not take double or extra doses. What may interact with this medicine? Do not take this medicine with any of the following medications: -cisapride -dofetilide -dronedarone -pimozide -thioridazine This medicine may also interact with the following medications: -alcohol -antihistamines for allergy, cough, and cold -atropine -barbiturate medicines for sleep or seizures, like phenobarbital -certain antibiotics like erythromycin or clarithromycin -certain medicines for anxiety or sleep -certain medicines for bladder problems like oxybutynin, tolterodine -certain medicines for depression or psychotic disturbances -certain medicines for irregular heart beat -certain medicines for Parkinson's disease like benztropine, trihexyphenidyl -certain medicines for seizures like phenobarbital, primidone -certain medicines for stomach problems like dicyclomine, hyoscyamine -certain medicines for travel sickness like scopolamine -ipratropium -narcotic medicines for pain -other medicines that prolong the QT interval (an abnormal heart rhythm) This list may not describe all possible interactions. Give your health care provider a list of all the medicines, herbs, non-prescription drugs, or dietary supplements you use. Also tell them if you smoke, drink alcohol, or use illegal drugs. Some items may interact with your medicine. What should I watch for while using this medicine? Tell your doctor or health care professional if your symptoms do  not improve. You may get drowsy or dizzy. Do not drive, use machinery, or do anything that needs mental alertness until you  know how this medicine affects you. Do not stand or sit up quickly, especially if you are an older patient. This reduces the risk of dizzy or fainting spells. Alcohol may interfere with the effect of this medicine. Avoid alcoholic drinks. Your mouth may get dry. Chewing sugarless gum or sucking hard candy, and drinking plenty of water may help. Contact your doctor if the problem does not go away or is severe. This medicine may cause dry eyes and blurred vision. If you wear contact lenses you may feel some discomfort. Lubricating drops may help. See your eye doctor if the problem does not go away or is severe. If you are receiving skin tests for allergies, tell your doctor you are using this medicine. What side effects may I notice from receiving this medicine? Side effects that you should report to your doctor or health care professional as soon as possible: -allergic reactions like skin rash, itching or hives, swelling of the face, lips, or tongue -changes in vision -confusion -fast, irregular heartbeat -seizures -tremor -trouble passing urine or change in the amount of urine Side effects that usually do not require medical attention (report to your doctor or health care professional if they continue or are bothersome): -constipation -drowsiness -dry mouth -headache -tiredness This list may not describe all possible side effects. Call your doctor for medical advice about side effects. You may report side effects to FDA at 1-800-FDA-1088. Where should I keep my medicine? Keep out of the reach of children. Store at room temperature between 15 and 30 degrees C (59 and 86 degrees F). Keep container tightly closed. Throw away any unused medicine after the expiration date. NOTE: This sheet is a summary. It may not cover all possible information. If you have questions about this medicine, talk to your doctor, pharmacist, or health care provider.  2019 Elsevier/Gold Standard (2018-04-03  13:25:13)

## 2018-10-02 NOTE — Progress Notes (Signed)
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  Patient has a history of depression and anxiety.  Patient today returns to clinic.  He reports he had his genetic testing done.  However discussed with patient that his testing report is not currently back.  Patient has to call his health insurance plan regarding the same.  Patient however reports he would like to be started on a medication at this time since he feels overwhelmed and anxious.  He also has trouble with sleep.  He denies any suicidality.  He denies any perceptual disturbances.  He has a therapist at [facility] and he will continue to see therapist as needed.  He will return to [facility] January 2 week.  Discussed adding BuSpar as well as hydroxyzine.  Patient agrees with plan. Visit Diagnosis:    ICD-10-CM   1. GAD (generalized anxiety disorder) F41.1   2. Social anxiety disorder F40.10   3. MDD (major depressive disorder), recurrent episode, mild ([place]) F33.0     Past Psychiatric History: Reviewed past psychiatric history from my progress note on 01/05/2018.  Past trials of Lexapro, Zoloft, Strattera  Past Medical History:  Past Medical History:  Diagnosis Date  . Anxiety    social and GAD  . Asthma   . Depression     Past Surgical History:  Procedure Laterality Date  . ADENOIDECTOMY     2004    Family Psychiatric History: Reviewed family psychiatric history from my progress note on 01/05/2018  Family History:  Family History  Problem Relation Age of Onset  . Depression Mother   . Hyperlipidemia Mother   . Hypertension Mother   . Anxiety disorder Mother   . Asthma Sister   . Cancer Paternal Grandmother   . Early death  Paternal Grandmother   . Cancer Paternal Grandfather     Social History: I have reviewed social history from my progress note on 01/05/2018 Social History   Socioeconomic History  . Marital status: Single    Spouse name: Not on file  . Number of children: 0  . Years of education: Not on file  . Highest education level: Some college, no degree  Occupational History    Comment: full time student  Social Needs  . Financial resource strain: Not on file  . Food insecurity:    Worry: Not on file    Inability: Not on file  . Transportation needs:    Medical: No    Non-medical: No  Tobacco Use  . Smoking status: Former Smoker    Types: E-cigarettes, Cigarettes  . Smokeless tobacco: Never Used  . Tobacco comment: tired a couple  times  Substance and Sexual Activity  . Alcohol use: Not Currently  . Drug use: Not Currently  . Sexual activity: Not Currently  Lifestyle  . Physical activity:    Days per week: 4 days    Minutes per session: 30 min  . Stress: Very much  Relationships  . Social connections:    Talks on phone: More than three times a week    Gets together: More than three times a week    Attends religious service: Never    Active  member of club or organization: No    Attends meetings of clubs or organizations: Never    Relationship status: Never married  Other Topics Concern  . Not on file  Social History Narrative   College at [employer]   Wears seat belt    Safe in relationship     Allergies: No Known Allergies  Metabolic Disorder Labs: No results found for: HGBA1C, MPG No results found for: PROLACTIN No results found for: CHOL, TRIG, HDL, CHOLHDL, VLDL, LDLCALC Lab Results  Component Value Date   TSH 2.600 01/27/2018   TSH 3.42 01/20/2012    Therapeutic Level Labs: No results found for: LITHIUM No results found for: VALPROATE No components found for:  CBMZ  Current Medications: Current Outpatient Medications  Medication Sig Dispense Refill  . Benzoyl  Peroxide (BENZOYL PEROXIDE) 10 % LIQD Apply topically at bedtime.    . busPIRone (BUSPAR) 7.5 MG tablet Take 1 tablet (7.5 mg total) by mouth 2 (two) times daily. 60 tablet 0  . hydrOXYzine (VISTARIL) 25 MG capsule Take 1 capsule (25 mg total) by mouth 3 (three) times daily as needed. For anxiety and sleep 90 capsule 1   No current facility-administered medications for this visit.      Musculoskeletal: Strength & Muscle Tone: within normal limits Gait & Station: normal Patient leans: N/A  Psychiatric Specialty Exam: Review of Systems  Psychiatric/Behavioral: The patient is nervous/anxious and has insomnia.   All other systems reviewed and are negative.   Blood pressure (!) 150/82, pulse 94, temperature 98.1 F (36.7 C), temperature source Oral, weight 220 lb (99.8 kg).Body mass index is 33.45 kg/m.  General Appearance: Casual  Eye Contact:  Fair  Speech:  Normal Rate  Volume:  Normal  Mood:  Anxious  Affect:  Congruent  Thought Process:  Goal Directed and Descriptions of Associations: Intact  Orientation:  Full (Time, Place, and Person)  Thought Content: Logical   Suicidal Thoughts:  No  Homicidal Thoughts:  No  Memory:  Immediate;   Fair Recent;   Fair Remote;   Fair  Judgement:  Fair  Insight:  Fair  Psychomotor Activity:  Normal  Concentration:  Concentration: Fair and Attention Span: Fair  Recall:  [employer] of Knowledge: Fair  Language: Fair  Akathisia:  No  Handed:  Right  AIMS (if indicated):denies tremors, rigidity,stiffness  Assets:  Communication Skills Desire for Improvement Social Support  ADL's:  Intact  Cognition: WNL  Sleep:  Poor   Screenings: PHQ2-9     Office Visit from 01/06/2018 in [place] Office Visit from 12/27/2017 in [place]  PHQ-2 Total Score  0  0       Assessment and Plan: [name] is a 22 yr old Caucasian male, single, lives in [place], has a history of depression, anxiety,  presented to the clinic today for a follow-up visit.  Patient continues to struggle with anxiety symptoms and sleep problems.  His GeneSight testing report is not currently back.  Patient however would like new medication initiation today.  Will continue plan as noted below.  Plan MDD Start BuSpar 7.5 mg p.o. twice daily Hydroxyzine 25 mg p.o. 3 times daily as needed  For GAD BuSpar and hydroxyzine as prescribed  For social anxiety disorder  Continue therapy with therapist at [facility].  Pending GeneSight testing report.  Follow-up in clinic in 1 to 2 weeks or sooner if needed.  More than 50 % of the time was spent for  psychoeducation and supportive psychotherapy and care coordination.  This note was generated in part or whole with voice recognition software. Voice recognition is usually quite accurate but there are transcription errors that can and very often do occur. I apologize for any typographical errors that were not detected and corrected.       Ursula Alert, MD 10/02/2018, 4:32 PM

## 2018-10-11 ENCOUNTER — Telehealth: Payer: Self-pay | Admitting: Psychiatry

## 2018-10-11 ENCOUNTER — Ambulatory Visit: Payer: 59 | Admitting: Psychiatry

## 2018-10-11 NOTE — Telephone Encounter (Signed)
Called and left message that genetic testing report has come back and to make appointment to be seen .

## 2018-11-07 ENCOUNTER — Other Ambulatory Visit: Payer: Self-pay

## 2018-11-07 ENCOUNTER — Encounter: Payer: Self-pay | Admitting: Psychiatry

## 2018-11-07 ENCOUNTER — Ambulatory Visit (INDEPENDENT_AMBULATORY_CARE_PROVIDER_SITE_OTHER): Payer: 59 | Admitting: Psychiatry

## 2018-11-07 VITALS — BP 145/85 | HR 106 | Temp 98.7°F | Wt 219.2 lb

## 2018-11-07 DIAGNOSIS — F4010 Social phobia, unspecified: Secondary | ICD-10-CM | POA: Diagnosis not present

## 2018-11-07 DIAGNOSIS — F411 Generalized anxiety disorder: Secondary | ICD-10-CM

## 2018-11-07 DIAGNOSIS — F330 Major depressive disorder, recurrent, mild: Secondary | ICD-10-CM | POA: Diagnosis not present

## 2018-11-07 DIAGNOSIS — R030 Elevated blood-pressure reading, without diagnosis of hypertension: Secondary | ICD-10-CM

## 2018-11-07 MED ORDER — BUSPIRONE HCL 7.5 MG PO TABS: 7.5000 mg | ORAL_TABLET | Freq: Three times a day (TID) | ORAL | 1 refills | Status: DC

## 2018-11-07 NOTE — Patient Instructions (Signed)
Buspirone tablets What is this medicine? BUSPIRONE (byoo SPYE rone) is used to treat anxiety disorders. This medicine may be used for other purposes; ask your health care provider or pharmacist if you have questions. COMMON BRAND NAME(S): BuSpar What should I tell my health care provider before I take this medicine? They need to know if you have any of these conditions: -kidney or liver disease -an unusual or allergic reaction to buspirone, other medicines, foods, dyes, or preservatives -pregnant or trying to get pregnant -breast-feeding How should I use this medicine? Take this medicine by mouth with a glass of water. Follow the directions on the prescription label. You may take this medicine with or without food. To ensure that this medicine always works the same way for you, you should take it either always with or always without food. Take your doses at regular intervals. Do not take your medicine more often than directed. Do not stop taking except on the advice of your doctor or health care professional. Talk to your pediatrician regarding the use of this medicine in children. Special care may be needed. Overdosage: If you think you have taken too much of this medicine contact a poison control center or emergency room at once. NOTE: This medicine is only for you. Do not share this medicine with others. What if I miss a dose? If you miss a dose, take it as soon as you can. If it is almost time for your next dose, take only that dose. Do not take double or extra doses. What may interact with this medicine? Do not take this medicine with any of the following medications: -linezolid -MAOIs like Carbex, Eldepryl, Marplan, Nardil, and Parnate -methylene blue -procarbazine This medicine may also interact with the following medications: -diazepam -digoxin -diltiazem -erythromycin -grapefruit juice -haloperidol -medicines for mental depression or mood problems -medicines for seizures like  carbamazepine, phenobarbital and phenytoin -nefazodone -other medications for anxiety -rifampin -ritonavir -some antifungal medicines like itraconazole, ketoconazole, and voriconazole -verapamil -warfarin This list may not describe all possible interactions. Give your health care provider a list of all the medicines, herbs, non-prescription drugs, or dietary supplements you use. Also tell them if you smoke, drink alcohol, or use illegal drugs. Some items may interact with your medicine. What should I watch for while using this medicine? Visit your doctor or health care professional for regular checks on your progress. It may take 1 to 2 weeks before your anxiety gets better. You may get drowsy or dizzy. Do not drive, use machinery, or do anything that needs mental alertness until you know how this drug affects you. Do not stand or sit up quickly, especially if you are an older patient. This reduces the risk of dizzy or fainting spells. Alcohol can make you more drowsy and dizzy. Avoid alcoholic drinks. What side effects may I notice from receiving this medicine? Side effects that you should report to your doctor or health care professional as soon as possible: -blurred vision or other vision changes -chest pain -confusion -difficulty breathing -feelings of hostility or anger -muscle aches and pains -numbness or tingling in hands or feet -ringing in the ears -skin rash and itching -vomiting -weakness Side effects that usually do not require medical attention (report to your doctor or health care professional if they continue or are bothersome): -disturbed dreams, nightmares -headache -nausea -restlessness or nervousness -sore throat and nasal congestion -stomach upset This list may not describe all possible side effects. Call your doctor for medical advice about side   effects. You may report side effects to FDA at 1-800-FDA-1088. Where should I keep my medicine? Keep out of the reach  of children. Store at room temperature below 30 degrees C (86 degrees F). Protect from light. Keep container tightly closed. Throw away any unused medicine after the expiration date. NOTE: This sheet is a summary. It may not cover all possible information. If you have questions about this medicine, talk to your doctor, pharmacist, or health care provider.  2019 Elsevier/Gold Standard (2010-04-30 18:06:11)  

## 2018-11-07 NOTE — Progress Notes (Addendum)
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  Patient has a history of depression and anxiety.  Patient today returns to clinic.  Patient had his genetic testing done.  Discuss genetic testing results with patient.  Collateral information was obtained from mother who was present during the visit.  Mother reports that patient continues to be anxious and has episodes of racing heart rate and nervousness.  Patient reports he has been noncompliant with his BuSpar which was prescribed last visit.  Patient however today agrees to take the medication.  He continues to struggle with anxiety symptoms.  He reports racing heart rate and panic symptoms especially at night.  He however reports sleep is good.  He denies any suicidality.  He denies any perceptual disturbances.  He reports school is going well.       Visit Diagnosis:    ICD-10-CM   1. GAD (generalized anxiety disorder) F41.1 busPIRone (BUSPAR) 7.5 MG tablet  2. Social anxiety disorder F40.10   3. MDD (major depressive disorder), recurrent episode, mild ([place]) F33.0   4. Elevated blood pressure reading R03.0    Past Psychiatric History:Reviewed past psychiatric history from my progress note on 01/05/2018.  Past trials  of Lexapro, Zoloft, Strattera.     Past Medical History:  Past Medical History:  Diagnosis Date  . Anxiety    social and GAD  . Asthma   . Depression     Past Surgical History:  Procedure Laterality Date  . ADENOIDECTOMY     2004    Family Psychiatric History: Reviewed family psychiatric history from my progress note on 01/05/2018.  Family History:  Family History  Problem Relation Age of Onset  . Depression Mother   .  Hyperlipidemia Mother   . Hypertension Mother   . Anxiety disorder Mother   . Asthma Sister   . Cancer Paternal Grandmother   . Early death Paternal Grandmother   . Cancer Paternal Grandfather     Social History: Reviewed social history from my progress note on 01/05/2018. Social History   Socioeconomic History  . Marital status: Single    Spouse name: Not on file  . Number of children: 0  . Years of education: Not on file  . Highest education level: Some college, no degree  Occupational History    Comment: full time student  Social Needs  . Financial resource strain: Not on file  . Food insecurity:    Worry: Not on file    Inability: Not on file  . Transportation needs:    Medical: No    Non-medical: No  Tobacco Use  . Smoking status: Former Smoker    Types: E-cigarettes, Cigarettes  . Smokeless tobacco: Never Used  . Tobacco comment: tired a couple  times  Substance and Sexual Activity  . Alcohol use: Not Currently  . Drug use: Not Currently  . Sexual activity: Not Currently  Lifestyle  . Physical activity:    Days per week: 4 days    Minutes per session: 30 min  . Stress: Very much  Relationships  . Social connections:    Talks on phone: More than three times a week    Gets together: More  than three times a week    Attends religious service: Never    Active member of club or organization: No    Attends meetings of clubs or organizations: Never    Relationship status: Never married  Other Topics Concern  . Not on file  Social History Narrative   College at [employer]   Wears seat belt    Safe in relationship     Allergies: No Known Allergies  Metabolic Disorder Labs: No results found for: HGBA1C, MPG No results found for: PROLACTIN No results found for: CHOL, TRIG, HDL, CHOLHDL, VLDL, LDLCALC Lab Results  Component Value Date   TSH 2.600 01/27/2018   TSH 3.42 01/20/2012    Therapeutic Level Labs: No results found for: LITHIUM No results found for:  VALPROATE No components found for:  CBMZ  Current Medications: Current Outpatient Medications  Medication Sig Dispense Refill  . Benzoyl Peroxide (BENZOYL PEROXIDE) 10 % LIQD Apply topically at bedtime.    . busPIRone (BUSPAR) 7.5 MG tablet Take 1 tablet (7.5 mg total) by mouth 3 (three) times daily. 90 tablet 1  . hydrOXYzine (VISTARIL) 25 MG capsule Take 1 capsule (25 mg total) by mouth 3 (three) times daily as needed. For anxiety and sleep 90 capsule 1   No current facility-administered medications for this visit.      Musculoskeletal: Strength & Muscle Tone: within normal limits Gait & Station: normal Patient leans: N/A  Psychiatric Specialty Exam: Review of Systems  Psychiatric/Behavioral: The patient is nervous/anxious.   All other systems reviewed and are negative.   Blood pressure (!) 145/85, pulse (!) 106, temperature 98.7 F (37.1 C), temperature source Oral, weight 219 lb 3.2 oz (99.4 kg).Body mass index is 33.33 kg/m.  General Appearance: Casual  Eye Contact:  Fair  Speech:  Clear and Coherent  Volume:  Normal  Mood:  Anxious  Affect:  Congruent  Thought Process:  Goal Directed and Descriptions of Associations: Intact  Orientation:  Full (Time, Place, and Person)  Thought Content: Logical   Suicidal Thoughts:  No  Homicidal Thoughts:  No  Memory:  Immediate;   Fair Recent;   Fair Remote;   Fair  Judgement:  Good  Insight:  Good  Psychomotor Activity:  Normal  Concentration:  Concentration: Fair and Attention Span: Fair  Recall:  [employer] of Knowledge: Fair  Language: Fair  Akathisia:  No  Handed:  Right  AIMS (if indicated): Denies tremors, rigidity, stiffness  Assets:  Communication Skills Desire for Improvement Social Support  ADL's:  Intact  Cognition: WNL  Sleep:  Fair   Screenings: PHQ2-9     Office Visit from 01/06/2018 in [place] Office Visit from 12/27/2017 in [place]  PHQ-2 Total Score  0   0       Assessment and Plan: Cory [name] is a 23 year old Caucasian male, single, lives in [place], has a history of depression, anxiety, presented to the clinic today for a follow-up visit.  Patient continues to struggle with anxiety symptoms.  Will continue to make medication readjustment.  Plan   MDD-improving BuSpar 7.5 mg p.o. tid  daily Hydroxyzine 25 mg p.o. 3 times daily as needed  For GAD-unstable Increase BuSpar 7.5 mg 3 times a day. Continue hydroxyzine as prescribed  For social anxiety disorder-improving Continue therapy with therapist at [facility].  For folate conversion reduction Add Methylfolate 7.5 mg p.o. daily  Elevated BP reading Discussed to monitor closely and discussed going back to PMD  if continues to be high.  GeneSight testing results reported-discussed and reviewed results with patient.  Follow-up in clinic in 1 month or sooner if needed.  I have spent atleast 15 minutes face to face with patient today. More than 50 % of the time was spent for psychoeducation and supportive psychotherapy and care coordination.  This note was generated in part or whole with voice recognition software. Voice recognition is usually quite accurate but there are transcription errors that can and very often do occur. I apologize for any typographical errors that were not detected and corrected.        Ursula Alert, MD 11/07/2018, 3:54 PM

## 2018-11-19 ENCOUNTER — Emergency Department: Payer: 59

## 2018-11-19 ENCOUNTER — Emergency Department
Admission: EM | Admit: 2018-11-19 | Discharge: 2018-11-19 | Disposition: A | Discharge status: Home / Self Care | Payer: 59 | Attending: Emergency Medicine | Admitting: Emergency Medicine

## 2018-11-19 ENCOUNTER — Other Ambulatory Visit: Payer: Self-pay

## 2018-11-19 ENCOUNTER — Encounter: Payer: Self-pay | Admitting: Emergency Medicine

## 2018-11-19 DIAGNOSIS — K2970 Gastritis, unspecified, without bleeding: Secondary | ICD-10-CM | POA: Diagnosis not present

## 2018-11-19 DIAGNOSIS — J45909 Unspecified asthma, uncomplicated: Secondary | ICD-10-CM | POA: Insufficient documentation

## 2018-11-19 DIAGNOSIS — Z79899 Other long term (current) drug therapy: Secondary | ICD-10-CM | POA: Diagnosis not present

## 2018-11-19 DIAGNOSIS — I10 Essential (primary) hypertension: Secondary | ICD-10-CM | POA: Diagnosis not present

## 2018-11-19 DIAGNOSIS — R079 Chest pain, unspecified: Secondary | ICD-10-CM | POA: Diagnosis present

## 2018-11-19 DIAGNOSIS — Z87891 Personal history of nicotine dependence: Secondary | ICD-10-CM | POA: Diagnosis not present

## 2018-11-19 HISTORY — DX: Essential (primary) hypertension: I10

## 2018-11-19 LAB — CBC
HCT: 43.8 % (ref 39.0–52.0)
Hemoglobin: 15.1 g/dL (ref 13.0–17.0)
MCH: 30.8 pg (ref 26.0–34.0)
MCHC: 34.5 g/dL (ref 30.0–36.0)
MCV: 89.4 fL (ref 80.0–100.0)
PLATELETS: 305 10*3/uL (ref 150–400)
RBC: 4.9 MIL/uL (ref 4.22–5.81)
RDW: 12.4 % (ref 11.5–15.5)
WBC: 8.1 10*3/uL (ref 4.0–10.5)
nRBC: 0 % (ref 0.0–0.2)

## 2018-11-19 LAB — BASIC METABOLIC PANEL
Anion gap: 10 (ref 5–15)
BUN: 19 mg/dL (ref 6–20)
CO2: 26 mmol/L (ref 22–32)
Calcium: 9.6 mg/dL (ref 8.9–10.3)
Chloride: 104 mmol/L (ref 98–111)
Creatinine, Ser: 1.17 mg/dL (ref 0.61–1.24)
Glucose, Bld: 102 mg/dL — ABNORMAL HIGH (ref 70–99)
Potassium: 3.7 mmol/L (ref 3.5–5.1)
Sodium: 140 mmol/L (ref 135–145)

## 2018-11-19 LAB — TROPONIN I: Troponin I: <0.03 ng/mL (ref <0.03)

## 2018-11-19 MED ORDER — SUCRALFATE 1 G PO TABS: 1.0000 g | ORAL_TABLET | Freq: Four times a day (QID) | ORAL | 0 refills | Status: DC

## 2018-11-19 MED ORDER — ALUM & MAG HYDROXIDE-SIMETH 200-200-20 MG/5ML PO SUSP
30.0000 mL | Freq: Once | ORAL | Status: AC
  Administered 2018-11-19: 30 mL via ORAL
  Filled 2018-11-19: qty 30

## 2018-11-19 MED ORDER — FAMOTIDINE 20 MG PO TABS: 20.0000 mg | ORAL_TABLET | Freq: Every day | ORAL | 1 refills | Status: DC

## 2018-11-19 MED ORDER — SODIUM CHLORIDE 0.9% FLUSH: 3.0000 mL | Freq: Once | INTRAVENOUS | Status: DC

## 2018-11-19 MED ORDER — LIDOCAINE VISCOUS HCL 2 % MT SOLN
15.0000 mL | Freq: Once | OROMUCOSAL | Status: AC
  Administered 2018-11-19: 15 mL via ORAL
  Filled 2018-11-19: qty 15

## 2018-11-19 NOTE — ED Triage Notes (Signed)
Pt arrives ambulatory to triage with c/o chest pain x weeks. Pt reports that tonight he has had left arm pain. Pt is in NAD.

## 2018-11-19 NOTE — ED Notes (Signed)
No peripheral IV placed this visit.    Discharge instructions reviewed with patient. Questions fielded by this RN. Patient verbalizes understanding of instructions. Patient discharged home in stable condition per [facility]. No acute distress noted at time of discharge.    

## 2018-11-19 NOTE — Discharge Instructions (Addendum)
Please seek medical attention for any high fevers, chest pain, shortness of breath, change in behavior, persistent vomiting, bloody stool or any other new or concerning symptoms.  

## 2018-11-19 NOTE — ED Provider Notes (Signed)
[facility] Emergency Department Provider Note   ____________________________________________   I have reviewed the triage vital signs and the nursing notes.   HISTORY  Chief Complaint Chest Pain   History limited by: Not Limited   HPI Cory [name] is a 23 y.o. male who presents to the emergency department today because of concern for chest pain.  Patient states that he has been having this chest pain for the past couple weeks.  Is located in the left side of his chest.  Describes it as pressure-like.  The past few days he has been having some discomfort in his left shoulder.  Patient has had some shortness of breath.  He denies any trauma to his chest.  Does have a history of acid reflux although has not been on medication for some time. Denies any fevers.   Per medical record review patient has a history of asthma, depression, HTN.   Past Medical History:  Diagnosis Date  . Anxiety    social and GAD  . Asthma   . Depression   . Hypertension     Patient Active Problem List   Diagnosis Date Noted  . GAD (generalized anxiety disorder) 03/29/2018  . Social anxiety disorder 03/29/2018  . Obesity (BMI 30.0-34.9) 03/29/2018  . Acute intractable headache 01/06/2018  . Anxiety and depression 12/29/2017  . Acne vulgaris 12/29/2017  . Lipoma of left upper extremity 12/29/2017    Past Surgical History:  Procedure Laterality Date  . ADENOIDECTOMY     2004    Prior to Admission medications   Medication Sig Start Date End Date Taking? Authorizing Provider  Benzoyl Peroxide (BENZOYL PEROXIDE) 10 % LIQD Apply topically at bedtime.    [provider]  busPIRone (BUSPAR) 7.5 MG tablet Take 1 tablet (7.5 mg total) by mouth 3 (three) times daily. 11/07/18   Ursula Alert, MD  hydrOXYzine (VISTARIL) 25 MG capsule Take 1 capsule (25 mg total) by mouth 3 (three) times daily as needed. For anxiety and sleep 10/02/18   Ursula Alert, MD     Allergies Patient has no known allergies.  Family History  Problem Relation Age of Onset  . Depression Mother   . Hyperlipidemia Mother   . Hypertension Mother   . Anxiety disorder Mother   . Asthma Sister   . Cancer Paternal Grandmother   . Early death Paternal Grandmother   . Cancer Paternal Grandfather     Social History Social History   Tobacco Use  . Smoking status: Former Smoker    Types: E-cigarettes, Cigarettes  . Smokeless tobacco: Never Used  . Tobacco comment: tired a couple  times  Substance Use Topics  . Alcohol use: Not Currently  . Drug use: Not Currently    Review of Systems Constitutional: No fever/chills Eyes: No visual changes. ENT: No sore throat. Cardiovascular: Positive for chest pain. Respiratory: Positive for shortness of breath. Gastrointestinal: No abdominal pain.  No nausea, no vomiting.  No diarrhea.   Genitourinary: Negative for dysuria. Musculoskeletal: Negative for back pain. Skin: Negative for rash. Neurological: Negative for headaches, focal weakness or numbness.  ____________________________________________   PHYSICAL EXAM:  VITAL SIGNS: ED Triage Vitals  Enc Vitals Group     BP 11/19/18 1957 139/68     Pulse Rate 11/19/18 1957 87     Resp 11/19/18 1957 18     Temp 11/19/18 1957 98.1 F (36.7 C)     Temp Source 11/19/18 1957 Oral     SpO2 11/19/18 1957  99 %     Weight 11/19/18 1953 220 lb (99.8 kg)     Height 11/19/18 1953 5\' 9"  (1.753 m)     Head Circumference --      Peak Flow --      Pain Score 11/19/18 1953 5   Constitutional: Alert and oriented.  Eyes: Conjunctivae are normal.  ENT      Head: Normocephalic and atraumatic.      Nose: No congestion/rhinnorhea.      Mouth/Throat: Mucous membranes are moist.      Neck: No stridor. Hematological/Lymphatic/Immunilogical: No cervical lymphadenopathy. Cardiovascular: Normal rate, regular rhythm.  No murmurs, rubs, or gallops.  Respiratory: Normal respiratory  effort without tachypnea nor retractions. Breath sounds are clear and equal bilaterally. No wheezes/rales/rhonchi. Gastrointestinal: Soft and non tender. No rebound. No guarding.  Genitourinary: Deferred Musculoskeletal: Normal range of motion in all extremities. Neurologic:  Normal speech and language. No gross focal neurologic deficits are appreciated.  Skin:  Skin is warm, dry and intact. No rash noted. Psychiatric: Mood and affect are normal. Speech and behavior are normal. Patient exhibits appropriate insight and judgment.  ____________________________________________    LABS (pertinent positives/negatives)  Trop <0.03 CBC wbc 8.1, hgb 15.1, plt 305 BMP wnl except glu 102  ____________________________________________   EKG  I, Nance Pear, attending physician, personally viewed and interpreted this EKG  EKG Time: 1953 Rate: 90 Rhythm: normal sinus rhythm Axis: normal Intervals: qtc 428 QRS: narrow ST changes: no st elevation Impression: normal ekg  ____________________________________________    RADIOLOGY  CXR No acute abnormality  ____________________________________________   PROCEDURES  Procedures  ____________________________________________   INITIAL IMPRESSION / ASSESSMENT AND PLAN / ED COURSE  Pertinent labs & imaging results that were available during my care of the patient were reviewed by me and considered in my medical decision making (see chart for details).   Patient presented to the emergency department today because of concerns for chest pain.  Chest x-ray without any evidence of pneumonia or pneumothorax.  At this point I doubt PE or dissection. Patient did feel better after GI cocktail. I think gastritis or esophagitis likely cause of the patients pain. Discussed this with the patient.   ___________________________________________   FINAL CLINICAL IMPRESSION(S) / ED DIAGNOSES  Final diagnoses:  Gastritis, presence of bleeding  unspecified, unspecified chronicity, unspecified gastritis type     Note: This dictation was prepared with Dragon dictation. Any transcriptional errors that result from this process are unintentional     Nance Pear, MD 11/20/18 [phone]

## 2018-12-11 ENCOUNTER — Ambulatory Visit (INDEPENDENT_AMBULATORY_CARE_PROVIDER_SITE_OTHER): Payer: 59 | Admitting: Psychiatry

## 2018-12-11 ENCOUNTER — Other Ambulatory Visit: Payer: Self-pay

## 2018-12-11 ENCOUNTER — Encounter: Payer: Self-pay | Admitting: Psychiatry

## 2018-12-11 VITALS — BP 138/82 | HR 73 | Temp 97.7°F | Wt 221.4 lb

## 2018-12-11 DIAGNOSIS — F411 Generalized anxiety disorder: Secondary | ICD-10-CM | POA: Diagnosis not present

## 2018-12-11 DIAGNOSIS — F4010 Social phobia, unspecified: Secondary | ICD-10-CM

## 2018-12-11 DIAGNOSIS — F330 Major depressive disorder, recurrent, mild: Secondary | ICD-10-CM

## 2018-12-11 MED ORDER — BUSPIRONE HCL 7.5 MG PO TABS: 7.5000 mg | ORAL_TABLET | Freq: Three times a day (TID) | ORAL | 0 refills | Status: DC

## 2018-12-11 NOTE — Progress Notes (Signed)
[place] MD OP Progress Note  12/11/2018 5:42 PM Cory [name]  MRN:  [id number]  Chief Complaint: 'I am here for follow up." Chief Complaint    Follow-up; Medication Refill     HPI: Cory [name] is a 23 year old Caucasian male, single, lives in [place], Ship broker at [employer], presented to clinic today for a follow-up visit.  Patient has a history of anxiety disorder and depression.  Patient today returns reporting he is compliant on his medications as prescribed.  Patient reports his mood symptoms are improving.  Patient reports sleep is overall improved.  He takes melatonin at bedtime.  He reports he is doing well in school.  He denies any problems with concentration or focus.  He was recently seen in the emergency department for gastritis, he has upcoming appointment with his providers for the same.  He denies any significant concerns at this time.  Patient denies any suicidality, homicidality or perceptual disturbances. Visit Diagnosis:    ICD-10-CM   1. GAD (generalized anxiety disorder) F41.1 busPIRone (BUSPAR) 7.5 MG tablet  2. Social anxiety disorder F40.10   3. MDD (major depressive disorder), recurrent episode, mild (Cory [name]) F33.0     Past Psychiatric History: I have reviewed past psychiatric history from my progress note on 01/05/2018.  Past trials of Lexapro, Zoloft, Strattera  Past Medical History:  Past Medical History:  Diagnosis Date  . Anxiety    social and GAD  . Asthma   . Depression   . Hypertension     Past Surgical History:  Procedure Laterality Date  . ADENOIDECTOMY     2004    Family Psychiatric History: I have reviewed family psychiatric history from my progress note on 01/05/2018 Family History:  Family History  Problem Relation Age of Onset  . Depression Mother   . Hyperlipidemia Mother   . Hypertension Mother   . Anxiety disorder Mother   . Asthma Sister   . Cancer Paternal Grandmother   . Early death Paternal Grandmother   . Cancer Paternal  Grandfather     Social History: I have reviewed social history from my progress note on 01/05/2018 Social History   Socioeconomic History  . Marital status: Single    Spouse name: Not on file  . Number of children: 0  . Years of education: Not on file  . Highest education level: Some college, no degree  Occupational History    Comment: full time student  Social Needs  . Financial resource strain: Not on file  . Food insecurity:    Worry: Not on file    Inability: Not on file  . Transportation needs:    Medical: No    Non-medical: No  Tobacco Use  . Smoking status: Former Smoker    Types: E-cigarettes, Cigarettes  . Smokeless tobacco: Never Used  . Tobacco comment: tired a couple  times  Substance and Sexual Activity  . Alcohol use: Not Currently  . Drug use: Not Currently  . Sexual activity: Not Currently  Lifestyle  . Physical activity:    Days per week: 4 days    Minutes per session: 30 min  . Stress: Very much  Relationships  . Social connections:    Talks on phone: More than three times a week    Gets together: More than three times a week    Attends religious service: Never    Active member of club or organization: No    Attends meetings of clubs or organizations: Never    Relationship status:  Never married  Other Topics Concern  . Not on file  Social History Narrative   College at [employer]   Wears seat belt    Safe in relationship     Allergies: No Known Allergies  Metabolic Disorder Labs: No results found for: HGBA1C, MPG No results found for: PROLACTIN No results found for: CHOL, TRIG, HDL, CHOLHDL, VLDL, LDLCALC Lab Results  Component Value Date   TSH 2.600 01/27/2018   TSH 3.42 01/20/2012    Therapeutic Level Labs: No results found for: LITHIUM No results found for: VALPROATE No components found for:  CBMZ  Current Medications: Current Outpatient Medications  Medication Sig Dispense Refill  . Benzoyl Peroxide (BENZOYL PEROXIDE) 10 % LIQD  Apply topically at bedtime.    . busPIRone (BUSPAR) 7.5 MG tablet Take 1 tablet (7.5 mg total) by mouth 3 (three) times daily. 270 tablet 0  . famotidine (PEPCID) 20 MG tablet Take 1 tablet (20 mg total) by mouth daily. 30 tablet 1  . hydrOXYzine (VISTARIL) 25 MG capsule Take 1 capsule (25 mg total) by mouth 3 (three) times daily as needed. For anxiety and sleep 90 capsule 1  . l-methylfolate-B6-B12 (METANX) 3-35-2 MG TABS tablet Take 1 tablet by mouth daily.    . sucralfate (CARAFATE) 1 g tablet Take 1 tablet (1 g total) by mouth 4 (four) times daily. 60 tablet 0   No current facility-administered medications for this visit.      Musculoskeletal: Strength & Muscle Tone: within normal limits Gait & Station: normal Patient leans: N/A  Psychiatric Specialty Exam: Review of Systems  Psychiatric/Behavioral: The patient is nervous/anxious (improving).   All other systems reviewed and are negative.   Blood pressure 138/82, pulse 73, temperature 97.7 F (36.5 C), temperature source Oral, weight 221 lb 6.4 oz (100.4 kg).Body mass index is 32.7 kg/m.  General Appearance: Casual  Eye Contact:  Fair  Speech:  Clear and Coherent  Volume:  Normal  Mood:  Anxious  Affect:  Appropriate  Thought Process:  Goal Directed and Descriptions of Associations: Intact  Orientation:  Full (Time, Place, and Person)  Thought Content: Logical   Suicidal Thoughts:  No  Homicidal Thoughts:  No  Memory:  Immediate;   Fair Recent;   Fair Remote;   Fair  Judgement:  Fair  Insight:  Fair  Psychomotor Activity:  Normal  Concentration:  Concentration: Fair and Attention Span: Fair  Recall:  [employer] of Knowledge: Fair  Language: Fair  Akathisia:  No  Handed:  Right  AIMS (if indicated):denies tremors, rigidity,stiffness  Assets:  Communication Skills Desire for Improvement Social Support  ADL's:  Intact  Cognition: WNL  Sleep:  Fair   Screenings: PHQ2-9     Office Visit from 01/06/2018 in [place] Office Visit from 12/27/2017 in [place]  PHQ-2 Total Score  0  0       Assessment and Plan: Cory [name] is a 23 year old Caucasian male, single, lives in [place], has a history of depression, anxiety, presented to clinic today for a follow-up visit.  Patient currently reports improvement in his mood symptoms.  We will continue medications as noted below.  Plan MDD-improving PHQ 9 equals 10 BuSpar 7.5 mg p.o. 3 times daily Hydroxyzine 25 mg p.o. 3 times daily PRN for anxiety symptoms  For GAD-improving BuSpar 7.5 mg p.o. 3 times daily GAD 7 equals 11  For social anxiety disorder-improving Patient reports he will start psychotherapy sessions with therapist at [facility]  [place].  Patient denies any other concerns today.  Follow-up in clinic in 2 to 3 months or sooner if needed.  I have spent atleast 15 minutes face to face with patient today. More than 50 % of the time was spent for psychoeducation and supportive psychotherapy and care coordination.  This note was generated in part or whole with voice recognition software. Voice recognition is usually quite accurate but there are transcription errors that can and very often do occur. I apologize for any typographical errors that were not detected and corrected.        Ursula Alert, MD 12/11/2018, 5:42 PM

## 2019-03-13 ENCOUNTER — Other Ambulatory Visit: Payer: Self-pay

## 2019-03-13 ENCOUNTER — Ambulatory Visit (INDEPENDENT_AMBULATORY_CARE_PROVIDER_SITE_OTHER): Payer: 59 | Admitting: Psychiatry

## 2019-03-13 DIAGNOSIS — Z5329 Procedure and treatment not carried out because of patient's decision for other reasons: Secondary | ICD-10-CM

## 2019-03-13 NOTE — Progress Notes (Signed)
Attempted to call patient - no response.

## 2019-09-13 IMAGING — CT CT HEAD W/O CM
3 of 4 series · 14 of 47 positions shown, 16 images · non-contrast
Comparison: None.

CLINICAL DATA: Headache for approximately 3 weeks

EXAM:
CT HEAD WITHOUT CONTRAST
TECHNIQUE: Contiguous axial images were obtained from the base of the skull
through the vertex without intravenous contrast.

[Series 2: head · axial · 0.39mm/px · z∈[-566,-446]mm · 8 of 28 slices shown, 10 images (1 of 3)]
[im 2/28  brain]
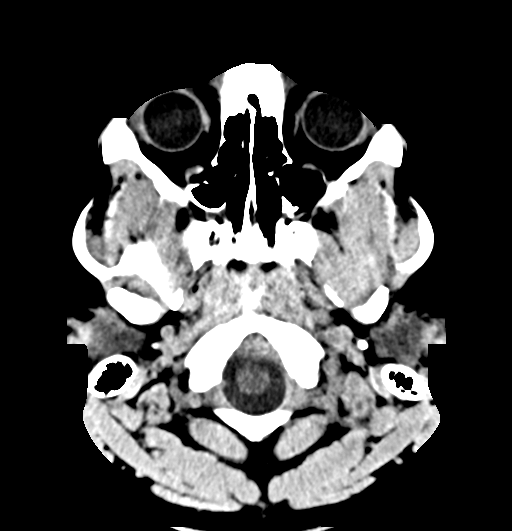
[im 2/28  bone]
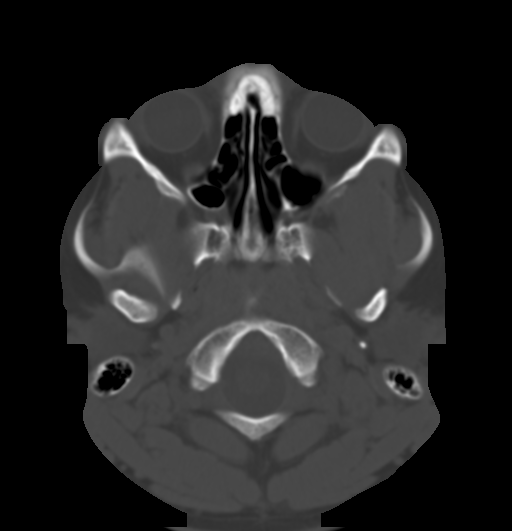
[im 6/28  brain]
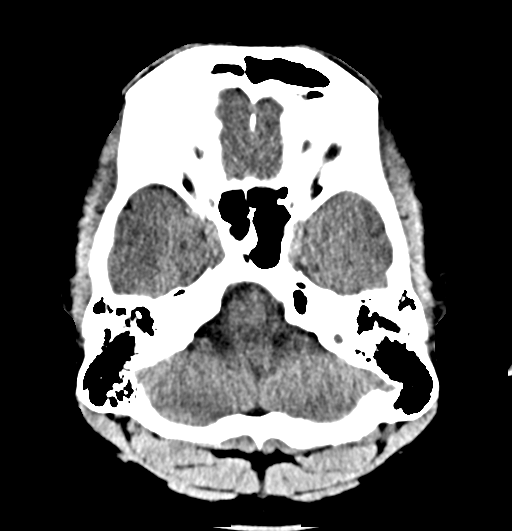
[im 10/28  brain]
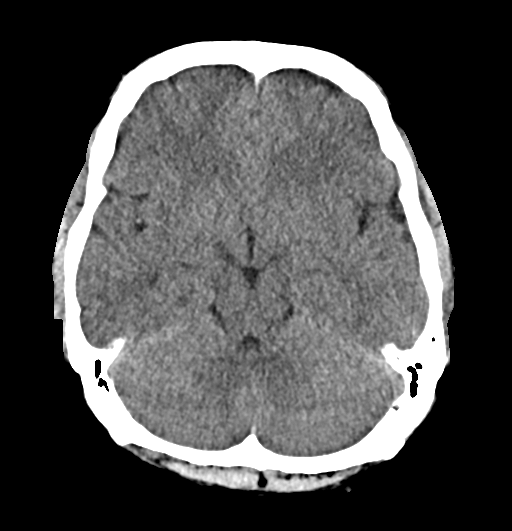
[im 12/28  brain]
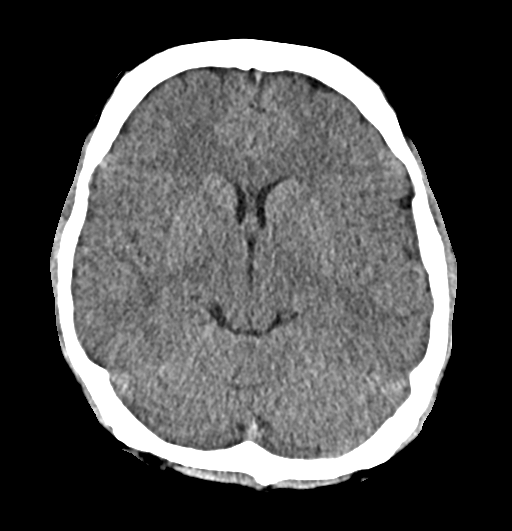
[im 16/28  brain]
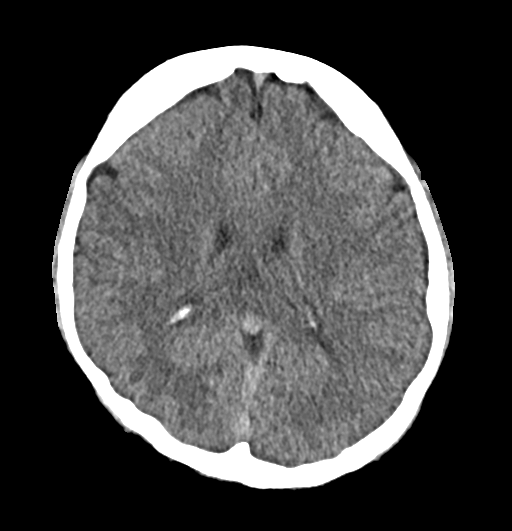
[im 16/28  bone]
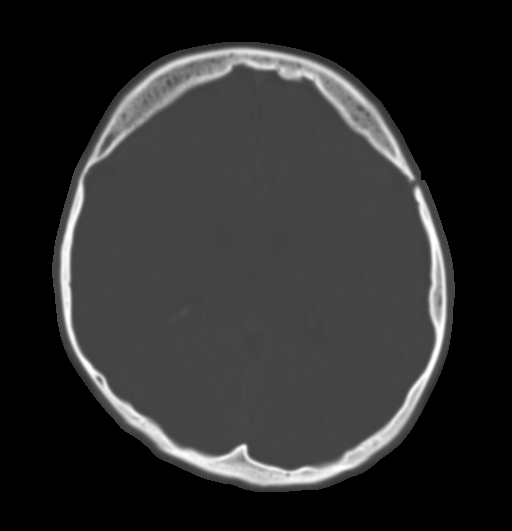
[im 18/28  brain]
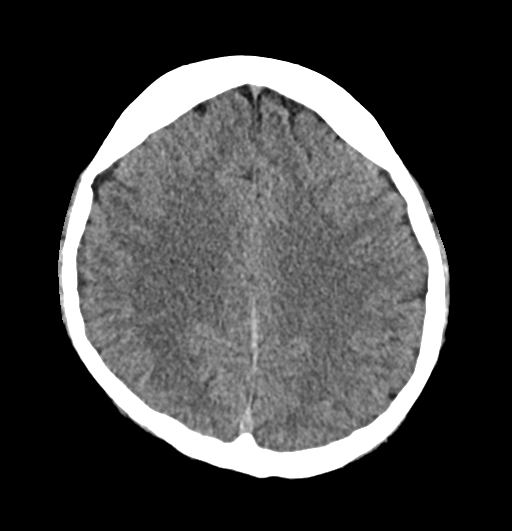
[im 22/28  brain]
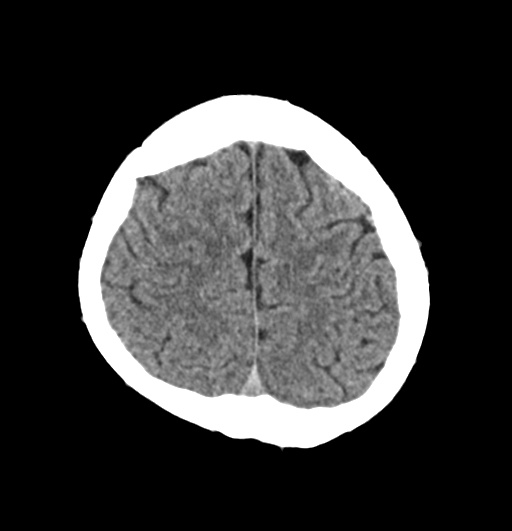
[im 26/28  brain]
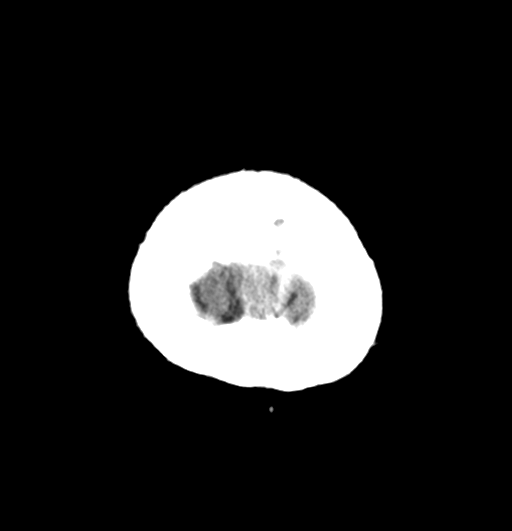

[Series 6: head · coronal · 0.28mm/px · 3 of 69 slices shown (2 of 3)]
[im 23/69  brain]
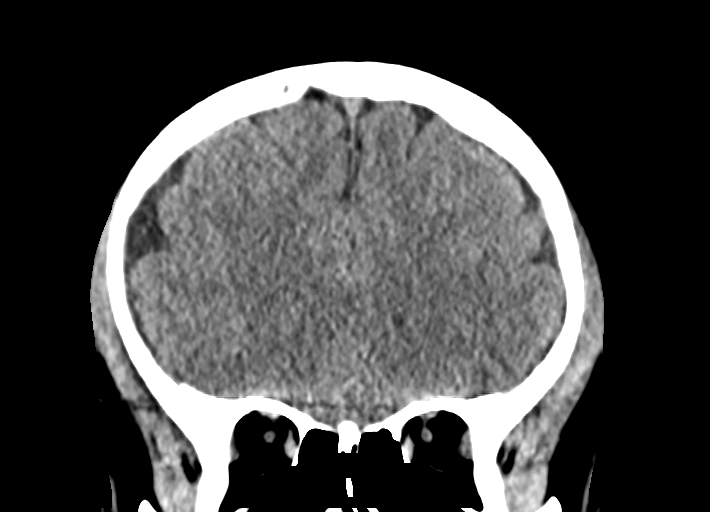
[im 31/69  brain]
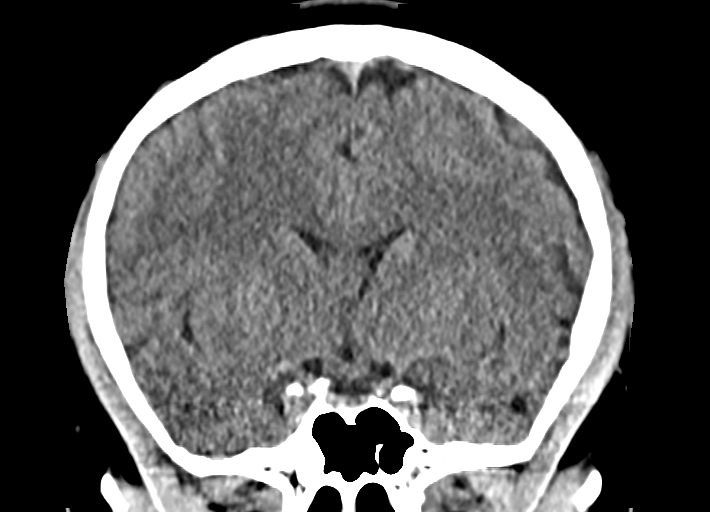
[im 38/69  brain]
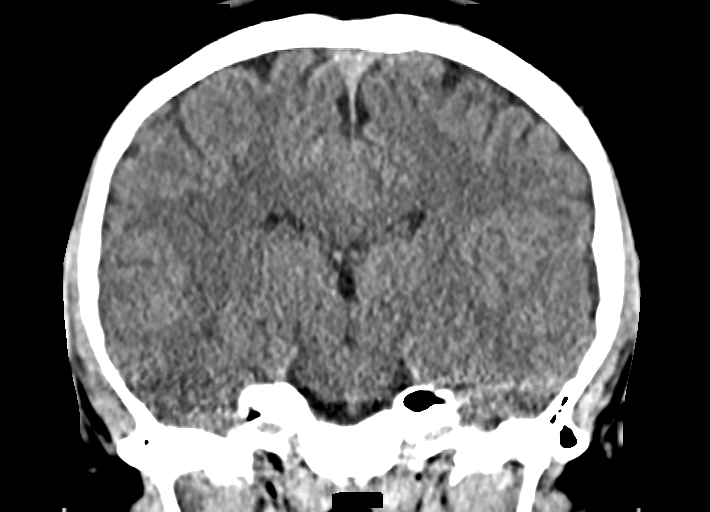

[Series 8: head · sagittal · 0.28mm/px · 3 of 66 slices shown (3 of 3)]
[im 22/66  brain]
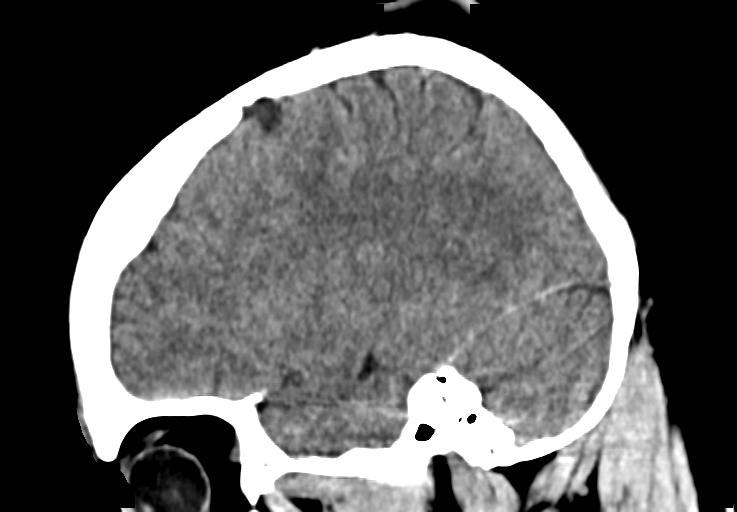
[im 33/66  brain]
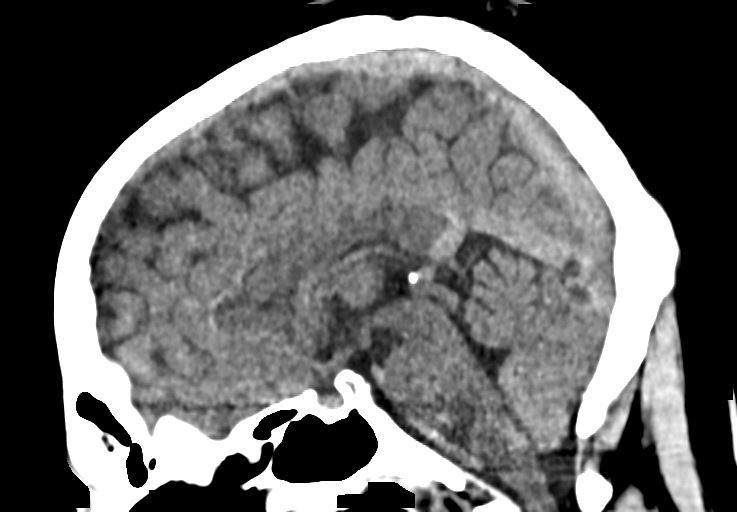
[im 44/66  brain]
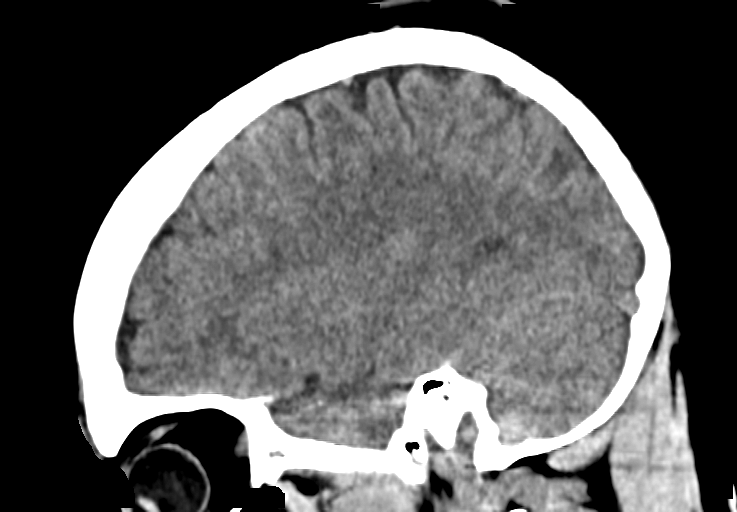

[14 of 47 positions shown; findings below may reference images not displayed]

FINDINGS: Brain: The ventricles are normal in size and configuration. There is
no intracranial mass, hemorrhage, extra-axial fluid collection, or
midline shift. Gray-white compartments are normal. No evident acute
infarct.

Vascular: No hyperdense vessel. No vascular calcifications are
appreciable.

Skull: The bony calvarium appears intact.

Sinuses/Orbits: Visualized paranasal sinuses are clear. Visualized
orbits appear symmetric bilaterally.

Other: Mastoid air cells are clear.
IMPRESSION: Study within normal limits.

## 2019-10-18 ENCOUNTER — Ambulatory Visit: Payer: 59 | Attending: Internal Medicine

## 2019-10-18 DIAGNOSIS — Z20822 Contact with and (suspected) exposure to covid-19: Secondary | ICD-10-CM

## 2019-10-19 LAB — NOVEL CORONAVIRUS, NAA: SARS-CoV-2, NAA: NOT DETECTED

## 2022-02-10 ENCOUNTER — Other Ambulatory Visit: Payer: Self-pay

## 2022-02-10 ENCOUNTER — Encounter: Payer: Self-pay | Admitting: Otolaryngology

## 2022-02-16 NOTE — Discharge Instructions (Signed)
[facility] ? ?Carloyn Manner, MD ? ? ?INFORMATION SHEET FOR A TONSILLECTOMY AND ADENDOIDECTOMY ? ?About Your Tonsils and Adenoids ? The tonsils and adenoids are normal body tissues that are part of our immune system.  They normally help to protect [place] against diseases that may enter our mouth and nose. However, sometimes the tonsils and/or adenoids become too large and obstruct our breathing, especially at night. ?  ? If either of these things happen it helps to remove the tonsils and adenoids in order to become healthier. The operation to remove the tonsils and adenoids is called a tonsillectomy and adenoidectomy. ? ?The Location of Your Tonsils and Adenoids ? The tonsils are located in the back of the throat on both side and sit in a cradle of muscles. The adenoids are located in the roof of the mouth, behind the nose, and closely associated with the opening of the Eustachian tube to the ear. ? ?Surgery on Tonsils and Adenoids ? A tonsillectomy and adenoidectomy is a short operation which takes about thirty minutes.  This includes being put to sleep and being awakened. Tonsillectomies and adenoidectomies are performed at [facility] and may require observation period in the recovery room prior to going home. Children are required to remain in recovery for at least 45 minutes.  ? ?Following the Operation for a Tonsillectomy ? A cautery machine is used to control bleeding. Bleeding from a tonsillectomy and adenoidectomy is minimal and postoperatively the risk of bleeding is approximately four percent, although this rarely life threatening. ? ?After your tonsillectomy and adenoidectomy post-op care at home: ?1. Our patients are able to go home the same day. You may be given prescriptions for pain medications, if indicated. ?2. It is extremely important to remember that fluid intake is of utmost importance after a tonsillectomy. The  amount that you drink must be maintained in the postoperative period. A good indication of whether a child is getting enough fluid is whether his/her urine output is constant. As long as children are urinating or wetting their diaper every 6 - 8 hours this is usually enough fluid intake.   ?3. Although rare, this is a risk of some bleeding in the first ten days after surgery. This usually occurs between day five and nine postoperatively. This risk of bleeding is approximately four percent. If you or your child should have any bleeding you should remain calm and notify our office or go directly to the emergency room at [facility] where they will contact us. Our doctors are available seven days a week for notification. We recommend sitting up quietly in a chair, place an ice pack on the front of the neck and spitting out the blood gently until we are able to contact you. Adults should gargle gently with ice water and this may help stop the bleeding. If the bleeding does not stop after a short time, i.e. 10 to 15 minutes, or seems to be increasing again, please contact us or go to the hospital.   ?4. It is common for the pain to be worse at 5 - 7 days postoperatively. This occurs because the ?scab? is peeling off and the mucous membrane (skin of the throat) is growing back where the tonsils were.   ?5. It is common for a low-grade fever, less than 102, during the first week after a tonsillectomy and adenoidectomy. It is usually due to not  drinking enough liquids, and we suggest your use liquid Tylenol (acetaminophen) or the pain medicine with Tylenol (acetaminophen) prescribed in order to keep your temperature below 102. Please follow the directions on the back of the bottle. ?6. Recommendations for post-operative pain in children and adults: ?a) For Children 12 and younger: Recommendations are for oral Tylenol (acetaminophen) and oral Motrin (Ibuprofen) along with a prescription dose of  Prednisolone which is a steroid to help with pain and swelling. Administer the Tylenol (acetaminophen) and Motrin as stated on bottle for patient's age/weight. Sometimes it may be necessary to alternate the Tylenol (acetaminophen) and Motrin for improved pain control. Motrin does last slightly longer so many patients benefit from being given this prior to bedtime. All children should avoid Aspirin products for 2 weeks following surgery. ?b) For children over the age of 81: Tylenol (acetaminophen) is the preferred first choice for pain control. Depending on your child's size, sometimes they will be given a combination of Tylenol (acetaminophen) and hydrocodone medication or sometimes it will be recommended they take Motrin (ibuprofen) in addition to the Tylenol (acetaminophen). Narcotics should always be used with caution in children following surgery as they can suppress their breathing and switching to over the counter Tylenol (acetaminophen) and Motrin (ibuprofen) as soon as possible is recommended. All patients should avoid Aspirin products for 2 weeks following surgery. ?c) Adults: Usually adults will require a narcotic pain medication following a tonsillectomy. This usually has either hydrocodone or oxycodone in it and can usually be taken every 4 to 6 hours as needed for moderate pain. If the medication does not have Tylenol (acetaminophen) in it, you may also supplement Tylenol (acetaminophen) as needed every 4 to 6 hours for breakthrough or mild pain. Adults are also given Viscous Lidocaine to swish and spit every 6 hours to help with topical pain. Adults should avoid Aspirin, Aleve, Motrin, and Ibuprofen products for 2 weeks following surgery as they can increase your risk of bleeding. ?7. If you happen to look in the mirror or into your child's mouth you will see white/gray patches on the back of the throat. This is what a scab looks like in the mouth and is normal after having a tonsillectomy and  adenoidectomy. They will disappear once the tonsil areas heal completely. However, it may cause a noticeable odor, and this too will disappear with time.     ?8. You or your child may experience ear pain after having a tonsillectomy and adenoidectomy.  This is called referred pain and comes from the throat, but it is felt in the ears.  Ear pain is quite common and expected. It will usually go away after ten days. There is usually nothing wrong with the ears, and it is primarily due to the healing area stimulating the nerve to the ear that runs along the side of the throat. Use either the prescribed pain medicine or Tylenol (acetaminophen) as needed.  ?9. The throat tissues after a tonsillectomy are obviously sensitive. Smoking around children who have had a tonsillectomy significantly increases the risk of bleeding. DO NOT SMOKE! ? ?What to Expect Each Day  ?First Day at Home ?1. Patients will be discharged home the same day.  ?2. Drink at least four glasses of liquid a day. Clear, cool liquids are recommended. Fruit juices containing citric acid are not recommended because they tend to cause pain. Carbonated beverages are allowed if you pour them from glass to glass to remove the bubbles as these tend to cause  discomfort. Avoid alcoholic beverages.  ?3. Eat very soft foods such as soups, broth, jello, custard, pudding, ice cream, popsicles, applesauce, mashed potatoes, and in general anything that you can crush between your tongue and the roof of your mouth. Try adding [employer] Mix into your food for extra calories. It is not uncommon to lose 5 to 10 pounds of fluid weight. The weight will be gained back quickly once you're feeling better and drinking more.  ?4. Sleep with your head elevated on two pillows for about three days to help decrease the swelling.  ?5. DO NOT SMOKE!  ?Day Two  ?1. Rest as much as possible. Use common sense in your activities.  ?2. Continue drinking at least four glasses  of liquid per day.  ?3. Follow the soft diet.  ?4. Use your pain medication as needed.  ?Day Three  ?1. Advance your activity as you are able and continue to follow the previous day's suggestions.  ?Days Four Through S

## 2022-02-24 ENCOUNTER — Other Ambulatory Visit: Payer: Self-pay

## 2022-02-24 ENCOUNTER — Encounter: Payer: Self-pay | Admitting: Otolaryngology

## 2022-02-24 ENCOUNTER — Ambulatory Visit: Payer: Managed Care, Other (non HMO) | Admitting: Anesthesiology

## 2022-02-24 ENCOUNTER — Ambulatory Visit
Admission: RE | Admit: 2022-02-24 | Discharge: 2022-02-24 | Disposition: A | Discharge status: Home / Self Care | Payer: Managed Care, Other (non HMO) | Attending: Otolaryngology | Admitting: Otolaryngology

## 2022-02-24 ENCOUNTER — Encounter
Admission: RE | Disposition: A | Discharge status: Home / Self Care | Payer: Self-pay | Source: Home / Self Care | Attending: Otolaryngology

## 2022-02-24 DIAGNOSIS — J45909 Unspecified asthma, uncomplicated: Secondary | ICD-10-CM | POA: Insufficient documentation

## 2022-02-24 DIAGNOSIS — Z87891 Personal history of nicotine dependence: Secondary | ICD-10-CM | POA: Insufficient documentation

## 2022-02-24 DIAGNOSIS — J351 Hypertrophy of tonsils: Secondary | ICD-10-CM | POA: Diagnosis present

## 2022-02-24 DIAGNOSIS — G4730 Sleep apnea, unspecified: Secondary | ICD-10-CM | POA: Diagnosis not present

## 2022-02-24 HISTORY — PX: TONSILLECTOMY: SHX5217

## 2022-02-24 SURGERY — TONSILLECTOMY
Anesthesia: General | Site: Throat | Laterality: Bilateral

## 2022-02-24 MED ORDER — DOCUSATE SODIUM 100 MG PO CAPS: 100.0000 mg | ORAL_CAPSULE | Freq: Two times a day (BID) | ORAL | 0 refills | Status: AC

## 2022-02-24 MED ORDER — FENTANYL CITRATE (PF) 100 MCG/2ML IJ SOLN
INTRAMUSCULAR | Status: DC | PRN
  Administered 2022-02-24 (×2): 50 ug via INTRAVENOUS

## 2022-02-24 MED ORDER — OXYCODONE HCL 5 MG/5ML PO SOLN: 10.0000 mg | ORAL | 0 refills | Status: AC | PRN

## 2022-02-24 MED ORDER — LACTATED RINGERS IV SOLN: INTRAVENOUS | Status: DC

## 2022-02-24 MED ORDER — ONDANSETRON HCL 4 MG PO TABS: 4.0000 mg | ORAL_TABLET | Freq: Three times a day (TID) | ORAL | 0 refills | Status: AC | PRN

## 2022-02-24 MED ORDER — PROPOFOL 10 MG/ML IV BOLUS
INTRAVENOUS | Status: DC | PRN
  Administered 2022-02-24: 200 mg via INTRAVENOUS
  Administered 2022-02-24: 50 mg via INTRAVENOUS

## 2022-02-24 MED ORDER — LIDOCAINE VISCOUS HCL 2 % MT SOLN: 10.0000 mL | Freq: Four times a day (QID) | OROMUCOSAL | 0 refills | Status: AC | PRN

## 2022-02-24 MED ORDER — LIDOCAINE HCL (CARDIAC) PF 100 MG/5ML IV SOSY
PREFILLED_SYRINGE | INTRAVENOUS | Status: DC | PRN
  Administered 2022-02-24: 50 mg via INTRAVENOUS

## 2022-02-24 MED ORDER — ONDANSETRON HCL 4 MG/2ML IJ SOLN
INTRAMUSCULAR | Status: DC | PRN
  Administered 2022-02-24: 4 mg via INTRAVENOUS

## 2022-02-24 MED ORDER — BUPIVACAINE HCL (PF) 0.25 % IJ SOLN
INTRAMUSCULAR | Status: DC | PRN
  Administered 2022-02-24: 1 mL

## 2022-02-24 MED ORDER — SUCCINYLCHOLINE CHLORIDE 200 MG/10ML IV SOSY
PREFILLED_SYRINGE | INTRAVENOUS | Status: DC | PRN
  Administered 2022-02-24: 140 mg via INTRAVENOUS

## 2022-02-24 MED ORDER — ROCURONIUM BROMIDE 100 MG/10ML IV SOLN
INTRAVENOUS | Status: DC | PRN
  Administered 2022-02-24: 10 mg via INTRAVENOUS

## 2022-02-24 MED ORDER — DEXMEDETOMIDINE (PRECEDEX) IN NS 20 MCG/5ML (4 MCG/ML) IV SYRINGE
PREFILLED_SYRINGE | INTRAVENOUS | Status: DC | PRN
  Administered 2022-02-24: 10 ug via INTRAVENOUS

## 2022-02-24 MED ORDER — MIDAZOLAM HCL 5 MG/5ML IJ SOLN
INTRAMUSCULAR | Status: DC | PRN
  Administered 2022-02-24: 2 mg via INTRAVENOUS

## 2022-02-24 MED ORDER — DEXAMETHASONE SODIUM PHOSPHATE 4 MG/ML IJ SOLN
INTRAMUSCULAR | Status: DC | PRN
Start: 2022-02-24 — End: 2022-02-24
  Administered 2022-02-24: 6 mg via INTRAVENOUS

## 2022-02-24 MED ORDER — OXYCODONE HCL 5 MG PO TABS: 5.0000 mg | ORAL_TABLET | Freq: Once | ORAL | Status: AC | PRN

## 2022-02-24 MED ORDER — OXYMETAZOLINE HCL 0.05 % NA SOLN
NASAL | Status: DC | PRN
  Administered 2022-02-24: 1 via TOPICAL

## 2022-02-24 MED ORDER — ACETAMINOPHEN 10 MG/ML IV SOLN
1000.0000 mg | Freq: Once | INTRAVENOUS | Status: AC
Start: 2022-02-24 — End: 2022-02-24
  Administered 2022-02-24: 1000 mg via INTRAVENOUS

## 2022-02-24 MED ORDER — LACTATED RINGERS IV SOLN: INTRAVENOUS | Status: DC | PRN

## 2022-02-24 MED ORDER — FENTANYL CITRATE PF 50 MCG/ML IJ SOSY: 25.0000 ug | PREFILLED_SYRINGE | INTRAMUSCULAR | Status: DC | PRN

## 2022-02-24 MED ORDER — OXYCODONE HCL 5 MG/5ML PO SOLN
5.0000 mg | Freq: Once | ORAL | Status: AC | PRN
  Administered 2022-02-24: 5 mg via ORAL

## 2022-02-24 SURGICAL SUPPLY — 18 items
BLADE ELECT COATED/INSUL 125 (ELECTRODE) ×3 IMPLANT
CANISTER SUCT 1200ML W/VALVE (MISCELLANEOUS) ×3 IMPLANT
CATH ROBINSON RED A/P 10FR (CATHETERS) ×3 IMPLANT
COAG SUCTION FOOTSWITCH 10FR (SUCTIONS) ×2 IMPLANT
ELECT REM PT RETURN 9FT ADLT (ELECTROSURGICAL) ×3
ELECTRODE REM PT RTRN 9FT ADLT (ELECTROSURGICAL) ×2 IMPLANT
GLOVE SURG GAMMEX PI TX LF 7.5 (GLOVE) ×3 IMPLANT
KIT TURNOVER KIT A (KITS) ×3 IMPLANT
NS IRRIG 500ML POUR BTL (IV SOLUTION) ×3 IMPLANT
PACK TONSIL AND ADENOID CUSTOM (PACKS) ×3 IMPLANT
PENCIL SMOKE EVACUATOR (MISCELLANEOUS) ×3 IMPLANT
SLEEVE SUCTION 125 (MISCELLANEOUS) ×3 IMPLANT
SOL ANTI-FOG 6CC FOG-OUT (MISCELLANEOUS) ×2 IMPLANT
SOL FOG-OUT ANTI-FOG 6CC (MISCELLANEOUS) ×1
SPONGE TONSIL 1 RF SGL (DISPOSABLE) ×2 IMPLANT
STRAP BODY AND KNEE 60X3 (MISCELLANEOUS) ×3 IMPLANT
SUT VIC AB 4-0 RB1 27 (SUTURE) ×3
SUT VIC AB 4-0 RB1 27X BRD (SUTURE) ×1 IMPLANT

## 2022-02-24 NOTE — H&P (Signed)
..  History and Physical paper copy reviewed and updated date of procedure and will be scanned into system.  Patient seen and examined.  

## 2022-02-24 NOTE — Anesthesia Preprocedure Evaluation (Signed)
Anesthesia Evaluation  Patient identified by MRN, date of birth, ID band Patient awake    History of Anesthesia Complications Negative for: history of anesthetic complications  Airway Mallampati: I  TM Distance: >3 FB Neck ROM: Full    Dental no notable dental hx.    Pulmonary asthma , sleep apnea (presumed 2/2 tonsils) , former smoker,    Pulmonary exam normal        Cardiovascular Exercise Tolerance: Good Normal cardiovascular exam     Neuro/Psych    GI/Hepatic negative GI ROS, Neg liver ROS,   Endo/Other  BMI 33  Renal/GU negative Renal ROS     Musculoskeletal   Abdominal   Peds  Hematology negative hematology ROS (+)   Anesthesia Other Findings   Reproductive/Obstetrics                             Anesthesia Physical Anesthesia Plan  ASA: 2  Anesthesia Plan: General   Post-op Pain Management: Ofirmev IV (intra-op) and Oxycodone PO   Induction: Intravenous  PONV Risk Score and Plan: 2 and Ondansetron, Dexamethasone, Midazolam and Treatment may vary due to age or medical condition  Airway Management Planned: Oral ETT  Additional Equipment: None  Intra-op Plan:   Post-operative Plan:   Informed Consent: I have reviewed the patients History and Physical, chart, labs and discussed the procedure including the risks, benefits and alternatives for the proposed anesthesia with the patient or authorized representative who has indicated his/her understanding and acceptance.       Plan Discussed with: CRNA  Anesthesia Plan Comments:         Anesthesia Quick Evaluation

## 2022-02-24 NOTE — Anesthesia Procedure Notes (Signed)
Procedure Name: Intubation Date/Time: 02/24/2022 11:01 AM Performed by: Dionne Bucy, CRNA Pre-anesthesia Checklist: Patient identified, Patient being monitored, Timeout performed, Emergency Drugs available and Suction available Patient Re-evaluated:Patient Re-evaluated prior to induction Oxygen Delivery Method: Circle system utilized Preoxygenation: Pre-oxygenation with 100% oxygen Induction Type: IV induction Ventilation: Mask ventilation without difficulty Laryngoscope Size: Mac and 3 Grade View: Grade II Tube type: Oral Rae Tube size: 7.0 mm Number of attempts: 1 Airway Equipment and Method: Stylet Placement Confirmation: ETT inserted through vocal cords under direct vision, positive ETCO2 and breath sounds checked- equal and bilateral Secured at: 21 cm Tube secured with: Tape Dental Injury: Teeth and Oropharynx as per pre-operative assessment

## 2022-02-24 NOTE — Op Note (Signed)
..  02/24/2022  11:56 AM    Cory [name]  [id number]   Pre-Op Dx:  Tonsil hypertrophy  Post-op Dx: same  Proc:Tonsillectomy > age 26  Surg: Vicki Chaffin  Anes:  General Endotracheal  EBL:  76m  Comp:  None  Findings:  3+ cryptic tonsils with tonsillolithiasis, severe scar to underlying musculature with no appreciable plane on left.  No adenoid hypertrophy seen.  Procedure: After the patient was identified in holding and the history and physical and consent was reviewed, the patient was taken to the operating room and placed in a supine position.  General endotracheal anesthesia was induced in the normal fashion.  At this time, the patient was rotated 45 degrees and a shoulder roll was placed.  At this time, a McIvor mouthgag was inserted into the patient's oral cavity and suspended from the MReginastand without injury to teeth, lips, or gums.  Next a red rubber catheter was inserted into the patient left nostril for retraction of the uvula and soft palate superiorly.  Next a curved Alice clamp was attached to the patient's right superior tonsillar pole and retracted medially and inferiorly.  A Bovie electrocautery was used to dissect the patient's right tonsil in a subcapsular plane.  Meticulous hemostasis was achieved with Bovie suction cautery.  At this time, the mouth gag was released from suspension for 1 minute.  Attention now was directed to the patient's left side.  In a similar fashion the curved Alice clamp was attached to the superior pole and this was retracted medially and inferiorly and the tonsil was excised in a subcapsular plane with Bovie electrocautery.  After completion of the second tonsil, meticulous hemostasis was continued.  At this time, attention was directed to the patient's Adenoids.  Under indirect visualization using an operating mirror, the adenoid tissue was visualized and noted to be absent in nature.  At this time, the patient's nasal cavity and  oral cavity was irrigated with sterile saline.  One ml of 0.25% Marcaine was injected into the anterior and posterior tonsillar fossa bilaterally.  The patient was allowed to rest for approximately 1 minute with re-evaluation of his tonsil fossas made.  A 4.0 vicryl was used to reapproximate the superior anterior and posterior tonsil pillar bilaterally for a more widely patent airway.  The patient was allowed to rest for another 1 minute.  This demonstrated good hemostasis bilaterally.  Following this  The care of patient was returned to anesthesia, awakened, and transferred to recovery in stable condition.  Dispo:  PACU to home  Plan: Soft diet.  Limit exercise and strenuous activity for 2 weeks.  Fluid hydration  Recheck my office three weeks.   CJeannie FendVaught 11:56 AM 02/24/2022

## 2022-02-24 NOTE — Anesthesia Postprocedure Evaluation (Signed)
Anesthesia Post Note  Patient: Cory [name]  Procedure(s) Performed: TONSILLECTOMY (Bilateral: Throat)     Patient location during evaluation: PACU Anesthesia Type: General Level of consciousness: awake and alert Pain management: pain level controlled Vital Signs Assessment: post-procedure vital signs reviewed and stable Respiratory status: spontaneous breathing, nonlabored ventilation, respiratory function stable and patient connected to nasal cannula oxygen Cardiovascular status: blood pressure returned to baseline and stable Postop Assessment: no apparent nausea or vomiting Anesthetic complications: no   No notable events documented.  Adele Barthel Anastassia Noack

## 2022-02-24 NOTE — Transfer of Care (Signed)
Immediate Anesthesia Transfer of Care Note  Patient: Cory [name]  Procedure(s) Performed: TONSILLECTOMY (Bilateral: Throat)  Patient Location: PACU  Anesthesia Type: General  Level of Consciousness: awake, alert  and patient cooperative  Airway and Oxygen Therapy: Patient Spontanous Breathing and Patient connected to supplemental oxygen  Post-op Assessment: Post-op Vital signs reviewed, Patient's Cardiovascular Status Stable, Respiratory Function Stable, Patent Airway and No signs of Nausea or vomiting  Post-op Vital Signs: Reviewed and stable  Complications: No notable events documented.

## 2022-02-25 ENCOUNTER — Encounter: Payer: Self-pay | Admitting: Otolaryngology

## 2022-02-26 LAB — SURGICAL PATHOLOGY
